# Patient Record
Sex: Female | Born: 1985 | Race: Black or African American | Hispanic: No | Marital: Married | Smoking: Never smoker
Health system: Southern US, Community
[De-identification: ages and names within clinical notes are randomized; demographics above are authoritative.]

## PROBLEM LIST (undated history)

## (undated) DIAGNOSIS — E0500 Thyrotoxicosis with diffuse goiter without thyrotoxic crisis or storm: Secondary | ICD-10-CM

## (undated) DIAGNOSIS — J45909 Unspecified asthma, uncomplicated: Secondary | ICD-10-CM

## (undated) DIAGNOSIS — R569 Unspecified convulsions: Secondary | ICD-10-CM

## (undated) HISTORY — PX: THYROIDECTOMY: SHX17

## (undated) HISTORY — PX: WISDOM TOOTH EXTRACTION: SHX21

---

## 2006-02-27 ENCOUNTER — Other Ambulatory Visit: Admission: RE | Admit: 2006-02-27 | Discharge: 2006-02-27 | Payer: Self-pay | Admitting: Obstetrics and Gynecology

## 2007-01-17 ENCOUNTER — Encounter: Admission: RE | Admit: 2007-01-17 | Discharge: 2007-01-17 | Payer: Self-pay | Admitting: Endocrinology

## 2007-02-07 ENCOUNTER — Encounter: Admission: RE | Admit: 2007-02-07 | Discharge: 2007-02-07 | Payer: Self-pay | Admitting: Endocrinology

## 2007-03-14 ENCOUNTER — Other Ambulatory Visit: Admission: RE | Admit: 2007-03-14 | Discharge: 2007-03-14 | Payer: Self-pay | Admitting: Family Medicine

## 2008-10-16 ENCOUNTER — Other Ambulatory Visit: Admission: RE | Admit: 2008-10-16 | Discharge: 2008-10-16 | Payer: Self-pay | Admitting: Family Medicine

## 2009-10-29 ENCOUNTER — Other Ambulatory Visit: Admission: RE | Admit: 2009-10-29 | Discharge: 2009-10-29 | Payer: Self-pay | Admitting: Family Medicine

## 2011-02-20 ENCOUNTER — Other Ambulatory Visit (HOSPITAL_COMMUNITY)
Admission: RE | Admit: 2011-02-20 | Discharge: 2011-02-20 | Disposition: A | Discharge status: Home / Self Care | Payer: 59 | Source: Ambulatory Visit | Attending: Family Medicine | Admitting: Family Medicine

## 2011-02-20 ENCOUNTER — Other Ambulatory Visit: Payer: Self-pay | Admitting: Family Medicine

## 2011-02-20 DIAGNOSIS — Z01419 Encounter for gynecological examination (general) (routine) without abnormal findings: Secondary | ICD-10-CM | POA: Insufficient documentation

## 2011-02-27 ENCOUNTER — Other Ambulatory Visit: Payer: Self-pay | Admitting: Family Medicine

## 2011-02-27 DIAGNOSIS — IMO0002 Reserved for concepts with insufficient information to code with codable children: Secondary | ICD-10-CM

## 2011-02-28 ENCOUNTER — Ambulatory Visit
Admission: RE | Admit: 2011-02-28 | Discharge: 2011-02-28 | Disposition: A | Discharge status: Home / Self Care | Payer: 59 | Source: Ambulatory Visit | Attending: Family Medicine | Admitting: Family Medicine

## 2011-02-28 DIAGNOSIS — IMO0002 Reserved for concepts with insufficient information to code with codable children: Secondary | ICD-10-CM

## 2012-02-27 ENCOUNTER — Other Ambulatory Visit: Payer: Self-pay | Admitting: Family Medicine

## 2012-02-27 ENCOUNTER — Other Ambulatory Visit (HOSPITAL_COMMUNITY)
Admission: RE | Admit: 2012-02-27 | Discharge: 2012-02-27 | Disposition: A | Discharge status: Home / Self Care | Payer: 59 | Source: Ambulatory Visit | Attending: Family Medicine | Admitting: Family Medicine

## 2012-02-27 DIAGNOSIS — Z01419 Encounter for gynecological examination (general) (routine) without abnormal findings: Secondary | ICD-10-CM | POA: Insufficient documentation

## 2013-10-08 ENCOUNTER — Other Ambulatory Visit (HOSPITAL_COMMUNITY)
Admission: RE | Admit: 2013-10-08 | Discharge: 2013-10-08 | Disposition: A | Discharge status: Home / Self Care | Payer: 59 | Source: Ambulatory Visit | Attending: Family Medicine | Admitting: Family Medicine

## 2013-10-08 ENCOUNTER — Other Ambulatory Visit: Payer: Self-pay | Admitting: Physician Assistant

## 2013-10-08 DIAGNOSIS — Z01419 Encounter for gynecological examination (general) (routine) without abnormal findings: Secondary | ICD-10-CM | POA: Insufficient documentation

## 2014-10-16 ENCOUNTER — Other Ambulatory Visit (HOSPITAL_COMMUNITY)
Admission: RE | Admit: 2014-10-16 | Discharge: 2014-10-16 | Disposition: A | Discharge status: Home / Self Care | Payer: BLUE CROSS/BLUE SHIELD | Source: Ambulatory Visit | Attending: Family Medicine | Admitting: Family Medicine

## 2014-10-16 ENCOUNTER — Other Ambulatory Visit: Payer: Self-pay | Admitting: Family Medicine

## 2014-10-16 DIAGNOSIS — Z01419 Encounter for gynecological examination (general) (routine) without abnormal findings: Secondary | ICD-10-CM | POA: Insufficient documentation

## 2014-10-20 LAB — CYTOLOGY - PAP

## 2017-04-30 ENCOUNTER — Other Ambulatory Visit (HOSPITAL_COMMUNITY)
Admission: RE | Admit: 2017-04-30 | Discharge: 2017-04-30 | Disposition: A | Discharge status: Home / Self Care | Payer: BLUE CROSS/BLUE SHIELD | Source: Ambulatory Visit | Attending: Family Medicine | Admitting: Family Medicine

## 2017-04-30 ENCOUNTER — Other Ambulatory Visit: Payer: Self-pay | Admitting: Family Medicine

## 2017-04-30 DIAGNOSIS — Z0000 Encounter for general adult medical examination without abnormal findings: Secondary | ICD-10-CM | POA: Insufficient documentation

## 2017-05-02 LAB — CYTOLOGY - PAP
DIAGNOSIS: NEGATIVE
HPV (WINDOPATH): NOT DETECTED

## 2017-05-29 NOTE — L&D Delivery Note (Signed)
Operative Delivery Note At 2:48 PM a viable female was delivered via Vaginal, Vacuum Investment banker, operational(Extractor).  Presentation: vertex; Position: Occiput,, Anterior; Station: +4.  Verbal consent: obtained from patient.  Risks and benefits discussed in detail.  Risks include, but are not limited to the risks of anesthesia, bleeding, infection, damage to maternal tissues, fetal cephalhematoma.  There is also the risk of inability to effect vaginal delivery of the head, or shoulder dystocia that cannot be resolved by established maneuvers, leading to the need for emergency cesarean section.  APGAR: 8, 9; weight  pending.   Placenta status: to path Cord:  with the following complications: .  Cord pH: n/a  Anesthesia:  Epidural, local Instruments: Mushroom cup Episiotomy: Right Mediolateral Lacerations: None Suture Repair: 2.0 3.0 vicryl Est. Blood Loss (mL): 158  Mom to postpartum.  Baby to Couplet care / Skin to Skin.  Candace SellerJennifer M Raylie Mcintosh 05/23/2018, 3:38 PM

## 2017-09-24 LAB — OB RESULTS CONSOLE ANTIBODY SCREEN: Antibody Screen: NEGATIVE

## 2017-09-24 LAB — OB RESULTS CONSOLE RUBELLA ANTIBODY, IGM: Rubella: IMMUNE

## 2017-09-24 LAB — OB RESULTS CONSOLE HIV ANTIBODY (ROUTINE TESTING): HIV: NONREACTIVE

## 2017-09-24 LAB — OB RESULTS CONSOLE ABO/RH: RH Type: NEGATIVE

## 2017-09-24 LAB — OB RESULTS CONSOLE HEPATITIS B SURFACE ANTIGEN: Hepatitis B Surface Ag: NEGATIVE

## 2017-09-24 LAB — OB RESULTS CONSOLE RPR: RPR: NONREACTIVE

## 2017-09-24 LAB — OB RESULTS CONSOLE GC/CHLAMYDIA
Chlamydia: NEGATIVE
GC PROBE AMP, GENITAL: NEGATIVE

## 2017-09-24 LAB — OB RESULTS CONSOLE GBS: STREP GROUP B AG: POSITIVE

## 2018-04-09 ENCOUNTER — Encounter (HOSPITAL_COMMUNITY): Payer: Self-pay | Admitting: *Deleted

## 2018-04-09 ENCOUNTER — Inpatient Hospital Stay (HOSPITAL_BASED_OUTPATIENT_CLINIC_OR_DEPARTMENT_OTHER): Payer: BLUE CROSS/BLUE SHIELD

## 2018-04-09 ENCOUNTER — Inpatient Hospital Stay (HOSPITAL_COMMUNITY)
Admission: AD | Admit: 2018-04-09 | Discharge: 2018-04-09 | Disposition: A | Discharge status: Home / Self Care | Payer: BLUE CROSS/BLUE SHIELD | Source: Ambulatory Visit | Attending: Obstetrics & Gynecology | Admitting: Obstetrics & Gynecology

## 2018-04-09 DIAGNOSIS — O26893 Other specified pregnancy related conditions, third trimester: Secondary | ICD-10-CM | POA: Diagnosis not present

## 2018-04-09 DIAGNOSIS — B379 Candidiasis, unspecified: Secondary | ICD-10-CM

## 2018-04-09 DIAGNOSIS — N888 Other specified noninflammatory disorders of cervix uteri: Secondary | ICD-10-CM | POA: Diagnosis not present

## 2018-04-09 DIAGNOSIS — O4693 Antepartum hemorrhage, unspecified, third trimester: Secondary | ICD-10-CM | POA: Diagnosis not present

## 2018-04-09 DIAGNOSIS — Z3A34 34 weeks gestation of pregnancy: Secondary | ICD-10-CM

## 2018-04-09 DIAGNOSIS — O4290 Premature rupture of membranes, unspecified as to length of time between rupture and onset of labor, unspecified weeks of gestation: Secondary | ICD-10-CM | POA: Diagnosis not present

## 2018-04-09 DIAGNOSIS — Z0371 Encounter for suspected problem with amniotic cavity and membrane ruled out: Secondary | ICD-10-CM

## 2018-04-09 HISTORY — DX: Unspecified asthma, uncomplicated: J45.909

## 2018-04-09 HISTORY — DX: Unspecified convulsions: R56.9

## 2018-04-09 HISTORY — DX: Thyrotoxicosis with diffuse goiter without thyrotoxic crisis or storm: E05.00

## 2018-04-09 LAB — URINALYSIS, ROUTINE W REFLEX MICROSCOPIC
Bacteria, UA: NONE SEEN
Bilirubin Urine: NEGATIVE
GLUCOSE, UA: NEGATIVE mg/dL
Hgb urine dipstick: NEGATIVE
KETONES UR: NEGATIVE mg/dL
Nitrite: NEGATIVE
PROTEIN: NEGATIVE mg/dL
Specific Gravity, Urine: 1.005 (ref 1.005–1.030)
pH: 6 (ref 5.0–8.0)

## 2018-04-09 MED ORDER — TERCONAZOLE 0.8 % VA CREA: 1.0000 | TOPICAL_CREAM | Freq: Every day | VAGINAL | 0 refills | Status: DC

## 2018-04-09 NOTE — MAU Note (Signed)
Noted some wetness yesterday and again last night.  No big gush. Just feels wet.  Having some cramping. No bleeding.

## 2018-04-09 NOTE — MAU Provider Note (Signed)
S: Ms. Lorenda PeckCambrelle R Ethelene Brownsnthony is a 32 y.o. G1P0 at 851w4d  who presents to MAU today complaining of leaking of fluid since last night, never had a gush of fluid, however has been leaking fluid. She denies vaginal bleeding. She denies contractions; + cramping.  She reports normal fetal movement.    O: BP 123/74 (BP Location: Left Arm)   Pulse 87   Temp 98.4 F (36.9 C) (Oral)   Resp 18   Wt 106 kg  GENERAL: Well-developed, well-nourished female in no acute distress.  HEAD: Normocephalic, atraumatic.  CHEST: Normal effort of breathing, regular heart rate ABDOMEN: Soft, nontender, gravid PELVIC: Normal external female genitalia. Vagina is pink and rugated. Cervix with normal contour, no lesions. Normal discharge.  Negative pooling.  No bleeding noted in the vault; When cervix was touched with swab bleeding noted. Cervix appears friable.   Cervical exam:  Dilation: Fingertip Exam by:: Shela CommonsJ. Duel Conrad NP   Fetal Monitoring: Baseline: 130 bpm Variability: moderate  Accelerations: 15x15 Decelerations: None Contractions: occasional UI   Results for orders placed or performed during the hospital encounter of 04/09/18 (from the past 24 hour(s))  Urinalysis, Routine w reflex microscopic     Status: Abnormal   Collection Time: 04/09/18 10:32 AM  Result Value Ref Range   Color, Urine STRAW (A) YELLOW   APPearance CLEAR CLEAR   Specific Gravity, Urine 1.005 1.005 - 1.030   pH 6.0 5.0 - 8.0   Glucose, UA NEGATIVE NEGATIVE mg/dL   Hgb urine dipstick NEGATIVE NEGATIVE   Bilirubin Urine NEGATIVE NEGATIVE   Ketones, ur NEGATIVE NEGATIVE mg/dL   Protein, ur NEGATIVE NEGATIVE mg/dL   Nitrite NEGATIVE NEGATIVE   Leukocytes, UA TRACE (A) NEGATIVE   RBC / HPF 0-5 0 - 5 RBC/hpf   WBC, UA 0-5 0 - 5 WBC/hpf   Bacteria, UA NONE SEEN NONE SEEN   Mucus PRESENT      A: SIUP at 151w4d Friable cervix  Unable to collect amnisure due to bleeding, fern slide difficult to read due to red cells, Limited US shows  AFI 20 Membranes intact  P:  Discharge home in stable condition F/U with MD as scheduled  Bleeding precautions Strict return precautions.  Pelvic rest   Mayur Duman, Harolyn RutherfordJennifer I, NP 04/09/2018 8:15 PM

## 2018-04-09 NOTE — Discharge Instructions (Signed)
Braxton Hicks Contractions °Contractions of the uterus can occur throughout pregnancy, but they are not always a sign that you are in labor. You may have practice contractions called Braxton Hicks contractions. These false labor contractions are sometimes confused with true labor. °What are Braxton Hicks contractions? °Braxton Hicks contractions are tightening movements that occur in the muscles of the uterus before labor. Unlike true labor contractions, these contractions do not result in opening (dilation) and thinning of the cervix. Toward the end of pregnancy (32-34 weeks), Braxton Hicks contractions can happen more often and may become stronger. These contractions are sometimes difficult to tell apart from true labor because they can be very uncomfortable. You should not feel embarrassed if you go to the hospital with false labor. °Sometimes, the only way to tell if you are in true labor is for your health care provider to look for changes in the cervix. The health care provider will do a physical exam and may monitor your contractions. If you are not in true labor, the exam should show that your cervix is not dilating and your water has not broken. °If there are other health problems associated with your pregnancy, it is completely safe for you to be sent home with false labor. You may continue to have Braxton Hicks contractions until you go into true labor. °How to tell the difference between true labor and false labor °True labor °· Contractions last 30-70 seconds. °· Contractions become very regular. °· Discomfort is usually felt in the top of the uterus, and it spreads to the lower abdomen and low back. °· Contractions do not go away with walking. °· Contractions usually become more intense and increase in frequency. °· The cervix dilates and gets thinner. °False labor °· Contractions are usually shorter and not as strong as true labor contractions. °· Contractions are usually irregular. °· Contractions  are often felt in the front of the lower abdomen and in the groin. °· Contractions may go away when you walk around or change positions while lying down. °· Contractions get weaker and are shorter-lasting as time goes on. °· The cervix usually does not dilate or become thin. °Follow these instructions at home: °· Take over-the-counter and prescription medicines only as told by your health care provider. °· Keep up with your usual exercises and follow other instructions from your health care provider. °· Eat and drink lightly if you think you are going into labor. °· If Braxton Hicks contractions are making you uncomfortable: °? Change your position from lying down or resting to walking, or change from walking to resting. °? Sit and rest in a tub of warm water. °? Drink enough fluid to keep your urine pale yellow. Dehydration may cause these contractions. °? Do slow and deep breathing several times an hour. °· Keep all follow-up prenatal visits as told by your health care provider. This is important. °Contact a health care provider if: °· You have a fever. °· You have continuous pain in your abdomen. °Get help right away if: °· Your contractions become stronger, more regular, and closer together. °· You have fluid leaking or gushing from your vagina. °· You pass blood-tinged mucus (bloody show). °· You have bleeding from your vagina. °· You have low back pain that you never had before. °· You feel your baby’s head pushing down and causing pelvic pressure. °· Your baby is not moving inside you as much as it used to. °Summary °· Contractions that occur before labor are called Braxton   Hicks contractions, false labor, or practice contractions. °· Braxton Hicks contractions are usually shorter, weaker, farther apart, and less regular than true labor contractions. True labor contractions usually become progressively stronger and regular and they become more frequent. °· Manage discomfort from Braxton Hicks contractions by  changing position, resting in a warm bath, drinking plenty of water, or practicing deep breathing. °This information is not intended to replace advice given to you by your health care provider. Make sure you discuss any questions you have with your health care provider. °Document Released: 09/28/2016 Document Revised: 09/28/2016 Document Reviewed: 09/28/2016 °Elsevier Interactive Patient Education © 2018 [employer] ° °Safe Medications in Pregnancy  ° °Acne: °Benzoyl Peroxide °Salicylic Acid ° °Backache/Headache: °Tylenol: 2 regular strength every 4 hours OR °             2 Extra strength every 6 hours ° °Colds/Coughs/Allergies: °Benadryl (alcohol free) 25 mg every 6 hours as needed °Breath right strips °Claritin °Cepacol throat lozenges °Chloraseptic throat spray °Cold-Eeze- up to three times per day °Cough drops, alcohol free °Flonase (by prescription only) °Guaifenesin °Mucinex °Robitussin DM (plain only, alcohol free) °Saline nasal spray/drops °Sudafed (pseudoephedrine) & Actifed ** use only after [redacted] weeks gestation and if you do not have high blood pressure °Tylenol °Vicks Vaporub °Zinc lozenges °Zyrtec  ° °Constipation: °Colace °Ducolax suppositories °Fleet enema °Glycerin suppositories °Metamucil °Milk of magnesia °Miralax °Senokot °Smooth move tea ° °Diarrhea: °Kaopectate °Imodium A-D ° °*NO pepto Bismol ° °Hemorrhoids: °Anusol °Anusol HC °Preparation H °Tucks ° °Indigestion: °Tums °Maalox °Mylanta °Zantac  °Pepcid ° °Insomnia: °Benadryl (alcohol free) 25mg every 6 hours as needed °Tylenol PM °Unisom, no Gelcaps ° °Leg Cramps: °Tums °MagGel ° °Nausea/Vomiting:  °Bonine °Dramamine °Emetrol °Ginger extract °Sea bands °Meclizine  °Nausea medication to take during pregnancy:  °Unisom (doxylamine succinate 25 mg tablets) Take one tablet daily at bedtime. If symptoms are not adequately controlled, the dose can be increased to a maximum recommended dose of two tablets daily (1/2 tablet in the morning, 1/2 tablet  mid-afternoon and one at bedtime). °Vitamin B6 100mg tablets. Take one tablet twice a day (up to 200 mg per day). ° °Skin Rashes: °Aveeno products °Benadryl cream or 25mg every 6 hours as needed °Calamine Lotion °1% cortisone cream ° °Yeast infection: °Gyne-lotrimin 7 °Monistat 7 ° ° °**If taking multiple medications, please check labels to avoid duplicating the same active ingredients °**take medication as directed on the label °** Do not exceed 4000 mg of tylenol in 24 hours °**Do not take medications that contain aspirin or ibuprofen ° ° ° ° °

## 2018-05-14 ENCOUNTER — Encounter (HOSPITAL_COMMUNITY): Payer: Self-pay | Admitting: *Deleted

## 2018-05-14 ENCOUNTER — Telehealth (HOSPITAL_COMMUNITY): Payer: Self-pay | Admitting: *Deleted

## 2018-05-17 ENCOUNTER — Inpatient Hospital Stay (HOSPITAL_COMMUNITY): Admission: AD | Admit: 2018-05-17 | Payer: BLUE CROSS/BLUE SHIELD | Source: Home / Self Care

## 2018-05-22 NOTE — H&P (Signed)
HPI: 32 y/o G1P0 @ 9162w6d estimated gestational age (as dated by LMP c/w 20 week ultrasound) presents for IOL.   no Leaking of Fluid,   no Vaginal Bleeding,   irregular Uterine Contractions,  + Fetal Movement.  Prenatal care has been provided by Dr. Charlotta Newtonzan  ROS: no HA, no epigastric pain, no visual changes.    Pregnancy complicated by: 1) O neg, s/p RhoGAM 2) Hypothyroidism- Levothyroxine increased to 175mcg -followed q trimester 3) Asthma- no meds, asymptomatic during pregnancy 4) GBS positive, plan for PCN in labor   Prenatal Transfer Tool  Maternal Diabetes: No Genetic Screening: Normal Maternal Ultrasounds/Referrals: Normal Fetal Ultrasounds or other Referrals:  None Maternal Substance Abuse:  No Significant Maternal Medications:  Meds include: Syntroid Significant Maternal Lab Results: Lab values include: Group B Strep positive, Rh negative   PNL:  GBS positive, Rub Immune, Hep B neg, RPR NR, HIV neg, GC/C neg, glucola:87 Hgb: 12 Blood type: O neg, antibody neg  Immunizations: Tdap: 10/10 Flu: 8/29  OBHx: primip PMHx:  Hypothyroidism, asthma Meds:  PNV, synthroid Allergy:  No Known Allergies SurgHx: none SocHx:   no Tobacco, no  EtOH, no Illicit Drugs  O:  Gen. AAOx3, NAD CV.  RRR  No murmur.  Resp. CTAB, no wheeze or crackles. Abd. Gravid,  no tenderness,  no rigidity,  no guarding Extr.  no edema B/L , no calf tenderness, neg Homan's B/L FHT: 125bpm SVE: 3-4/50/-2, vertex    Labs: see orders  A/P:  32 y.o. G1P0 @ 9762w6d EGA who presents for IOL -FWB:  Reassuring by doppler -Labor: plan for induction with pitocin as pt has a favorable cervix -GBS: positive, PCN per protocol -Pain management: IV or epidural upon request  Myna HidalgoJennifer Shikita Vaillancourt, DO [phone] (cell) [phone] (office)

## 2018-05-23 ENCOUNTER — Other Ambulatory Visit: Payer: Self-pay

## 2018-05-23 ENCOUNTER — Inpatient Hospital Stay (HOSPITAL_COMMUNITY): Payer: BLUE CROSS/BLUE SHIELD | Admitting: Anesthesiology

## 2018-05-23 ENCOUNTER — Inpatient Hospital Stay (HOSPITAL_COMMUNITY)
Admission: RE | Admit: 2018-05-23 | Discharge: 2018-05-25 | DRG: 807 | Disposition: A | Discharge status: Home / Self Care | Payer: BLUE CROSS/BLUE SHIELD | Attending: Obstetrics & Gynecology | Admitting: Obstetrics & Gynecology

## 2018-05-23 ENCOUNTER — Inpatient Hospital Stay (HOSPITAL_COMMUNITY): Admission: RE | Admit: 2018-05-23 | Payer: BLUE CROSS/BLUE SHIELD | Source: Ambulatory Visit

## 2018-05-23 ENCOUNTER — Encounter (HOSPITAL_COMMUNITY): Payer: Self-pay

## 2018-05-23 DIAGNOSIS — D649 Anemia, unspecified: Secondary | ICD-10-CM | POA: Diagnosis present

## 2018-05-23 DIAGNOSIS — O99280 Endocrine, nutritional and metabolic diseases complicating pregnancy, unspecified trimester: Secondary | ICD-10-CM | POA: Diagnosis present

## 2018-05-23 DIAGNOSIS — E039 Hypothyroidism, unspecified: Secondary | ICD-10-CM | POA: Diagnosis present

## 2018-05-23 DIAGNOSIS — J45909 Unspecified asthma, uncomplicated: Secondary | ICD-10-CM | POA: Diagnosis present

## 2018-05-23 DIAGNOSIS — Z6791 Unspecified blood type, Rh negative: Secondary | ICD-10-CM

## 2018-05-23 DIAGNOSIS — O26893 Other specified pregnancy related conditions, third trimester: Secondary | ICD-10-CM | POA: Diagnosis present

## 2018-05-23 DIAGNOSIS — O9902 Anemia complicating childbirth: Secondary | ICD-10-CM | POA: Diagnosis present

## 2018-05-23 DIAGNOSIS — O99284 Endocrine, nutritional and metabolic diseases complicating childbirth: Secondary | ICD-10-CM | POA: Diagnosis present

## 2018-05-23 DIAGNOSIS — O99824 Streptococcus B carrier state complicating childbirth: Secondary | ICD-10-CM | POA: Diagnosis present

## 2018-05-23 DIAGNOSIS — O480 Post-term pregnancy: Secondary | ICD-10-CM | POA: Diagnosis present

## 2018-05-23 DIAGNOSIS — O9952 Diseases of the respiratory system complicating childbirth: Secondary | ICD-10-CM | POA: Diagnosis present

## 2018-05-23 DIAGNOSIS — Z3A40 40 weeks gestation of pregnancy: Secondary | ICD-10-CM

## 2018-05-23 LAB — TYPE AND SCREEN
ABO/RH(D): O NEG
Antibody Screen: NEGATIVE

## 2018-05-23 LAB — RPR: RPR Ser Ql: NONREACTIVE

## 2018-05-23 LAB — CBC
HCT: 35.2 % — ABNORMAL LOW (ref 36.0–46.0)
Hemoglobin: 11.7 g/dL — ABNORMAL LOW (ref 12.0–15.0)
MCH: 32 pg (ref 26.0–34.0)
MCHC: 33.2 g/dL (ref 30.0–36.0)
MCV: 96.2 fL (ref 80.0–100.0)
Platelets: 176 10*3/uL (ref 150–400)
RBC: 3.66 MIL/uL — ABNORMAL LOW (ref 3.87–5.11)
RDW: 13.8 % (ref 11.5–15.5)
WBC: 9.7 10*3/uL (ref 4.0–10.5)
nRBC: 0 % (ref 0.0–0.2)

## 2018-05-23 LAB — ABO/RH: ABO/RH(D): O NEG

## 2018-05-23 MED ORDER — OXYCODONE-ACETAMINOPHEN 5-325 MG PO TABS: 1.0000 | ORAL_TABLET | ORAL | Status: DC | PRN

## 2018-05-23 MED ORDER — ONDANSETRON HCL 4 MG/2ML IJ SOLN: 4.0000 mg | INTRAMUSCULAR | Status: DC | PRN

## 2018-05-23 MED ORDER — LACTATED RINGERS IV SOLN: 500.0000 mL | Freq: Once | INTRAVENOUS | Status: DC

## 2018-05-23 MED ORDER — LACTATED RINGERS IV SOLN
INTRAVENOUS | Status: DC
  Administered 2018-05-23 (×2): via INTRAVENOUS

## 2018-05-23 MED ORDER — WITCH HAZEL-GLYCERIN EX PADS: 1.0000 "application " | MEDICATED_PAD | CUTANEOUS | Status: DC | PRN

## 2018-05-23 MED ORDER — ONDANSETRON HCL 4 MG PO TABS: 4.0000 mg | ORAL_TABLET | ORAL | Status: DC | PRN

## 2018-05-23 MED ORDER — PRENATAL MULTIVITAMIN CH
1.0000 | ORAL_TABLET | Freq: Every day | ORAL | Status: DC
  Administered 2018-05-24 – 2018-05-25 (×2): 1 via ORAL
  Filled 2018-05-23 (×2): qty 1

## 2018-05-23 MED ORDER — TERBUTALINE SULFATE 1 MG/ML IJ SOLN
0.2500 mg | Freq: Once | INTRAMUSCULAR | Status: DC | PRN
  Filled 2018-05-23: qty 1

## 2018-05-23 MED ORDER — OXYTOCIN 40 UNITS IN LACTATED RINGERS INFUSION - SIMPLE MED: 2.5000 [IU]/h | INTRAVENOUS | Status: DC

## 2018-05-23 MED ORDER — SODIUM CHLORIDE 0.9 % IV SOLN
5.0000 10*6.[IU] | Freq: Once | INTRAVENOUS | Status: AC
  Administered 2018-05-23: 5 10*6.[IU] via INTRAVENOUS
  Filled 2018-05-23: qty 5

## 2018-05-23 MED ORDER — OXYTOCIN 40 UNITS IN LACTATED RINGERS INFUSION - SIMPLE MED
1.0000 m[IU]/min | INTRAVENOUS | Status: DC
  Administered 2018-05-23: 2 m[IU]/min via INTRAVENOUS
  Filled 2018-05-23: qty 1000

## 2018-05-23 MED ORDER — SENNOSIDES-DOCUSATE SODIUM 8.6-50 MG PO TABS
2.0000 | ORAL_TABLET | ORAL | Status: DC
  Administered 2018-05-23 – 2018-05-24 (×2): 2 via ORAL
  Filled 2018-05-23 (×2): qty 2

## 2018-05-23 MED ORDER — OXYCODONE-ACETAMINOPHEN 5-325 MG PO TABS: 2.0000 | ORAL_TABLET | ORAL | Status: DC | PRN

## 2018-05-23 MED ORDER — ACETAMINOPHEN 325 MG PO TABS: 650.0000 mg | ORAL_TABLET | ORAL | Status: DC | PRN

## 2018-05-23 MED ORDER — PHENYLEPHRINE 40 MCG/ML (10ML) SYRINGE FOR IV PUSH (FOR BLOOD PRESSURE SUPPORT)
80.0000 ug | PREFILLED_SYRINGE | INTRAVENOUS | Status: DC | PRN
  Filled 2018-05-23 (×2): qty 10

## 2018-05-23 MED ORDER — LIDOCAINE HCL (PF) 1 % IJ SOLN
INTRAMUSCULAR | Status: DC | PRN
  Administered 2018-05-23 (×2): 5 mL via EPIDURAL

## 2018-05-23 MED ORDER — EPHEDRINE 5 MG/ML INJ
10.0000 mg | INTRAVENOUS | Status: DC | PRN
  Filled 2018-05-23: qty 2

## 2018-05-23 MED ORDER — ZOLPIDEM TARTRATE 5 MG PO TABS: 5.0000 mg | ORAL_TABLET | Freq: Every evening | ORAL | Status: DC | PRN

## 2018-05-23 MED ORDER — DIPHENHYDRAMINE HCL 25 MG PO CAPS: 25.0000 mg | ORAL_CAPSULE | Freq: Four times a day (QID) | ORAL | Status: DC | PRN

## 2018-05-23 MED ORDER — IBUPROFEN 600 MG PO TABS
600.0000 mg | ORAL_TABLET | Freq: Four times a day (QID) | ORAL | Status: DC
  Administered 2018-05-23 – 2018-05-25 (×8): 600 mg via ORAL
  Filled 2018-05-23 (×8): qty 1

## 2018-05-23 MED ORDER — DIPHENHYDRAMINE HCL 50 MG/ML IJ SOLN: 12.5000 mg | INTRAMUSCULAR | Status: DC | PRN

## 2018-05-23 MED ORDER — OXYTOCIN BOLUS FROM INFUSION
500.0000 mL | Freq: Once | INTRAVENOUS | Status: AC
  Administered 2018-05-23: 500 mL via INTRAVENOUS

## 2018-05-23 MED ORDER — FENTANYL 2.5 MCG/ML BUPIVACAINE 1/10 % EPIDURAL INFUSION (WH - ANES)
14.0000 mL/h | INTRAMUSCULAR | Status: DC | PRN
  Administered 2018-05-23: 14 mL/h via EPIDURAL
  Filled 2018-05-23: qty 100

## 2018-05-23 MED ORDER — BUTORPHANOL TARTRATE 1 MG/ML IJ SOLN: 1.0000 mg | INTRAMUSCULAR | Status: DC | PRN

## 2018-05-23 MED ORDER — COCONUT OIL OIL
1.0000 "application " | TOPICAL_OIL | Status: DC | PRN
  Administered 2018-05-24: 1 via TOPICAL
  Filled 2018-05-23: qty 120

## 2018-05-23 MED ORDER — SOD CITRATE-CITRIC ACID 500-334 MG/5ML PO SOLN: 30.0000 mL | ORAL | Status: DC | PRN

## 2018-05-23 MED ORDER — LIDOCAINE HCL (PF) 1 % IJ SOLN
30.0000 mL | INTRAMUSCULAR | Status: AC | PRN
  Administered 2018-05-23: 30 mL via SUBCUTANEOUS
  Filled 2018-05-23: qty 30

## 2018-05-23 MED ORDER — PENICILLIN G 3 MILLION UNITS IVPB - SIMPLE MED
3.0000 10*6.[IU] | INTRAVENOUS | Status: DC
  Administered 2018-05-23: 3 10*6.[IU] via INTRAVENOUS
  Filled 2018-05-23 (×3): qty 100

## 2018-05-23 MED ORDER — BENZOCAINE-MENTHOL 20-0.5 % EX AERO
1.0000 "application " | INHALATION_SPRAY | CUTANEOUS | Status: DC | PRN
  Administered 2018-05-23: 1 via TOPICAL
  Filled 2018-05-23: qty 56

## 2018-05-23 MED ORDER — LACTATED RINGERS IV SOLN: 500.0000 mL | INTRAVENOUS | Status: DC | PRN

## 2018-05-23 MED ORDER — ONDANSETRON HCL 4 MG/2ML IJ SOLN
4.0000 mg | Freq: Four times a day (QID) | INTRAMUSCULAR | Status: DC | PRN
  Administered 2018-05-23: 4 mg via INTRAVENOUS
  Filled 2018-05-23: qty 2

## 2018-05-23 MED ORDER — SIMETHICONE 80 MG PO CHEW: 80.0000 mg | CHEWABLE_TABLET | ORAL | Status: DC | PRN

## 2018-05-23 MED ORDER — DIBUCAINE 1 % RE OINT: 1.0000 "application " | TOPICAL_OINTMENT | RECTAL | Status: DC | PRN

## 2018-05-23 NOTE — Progress Notes (Signed)
OB PN:  S: Pt resting comfortably- rates contractions 4/10, no acute complaints  O: BP 129/77   Pulse 68   Temp 97.9 F (36.6 C) (Oral)   Resp 18   Ht 5\' 7"  (1.702 m)   Wt 113.3 kg   BMI 39.12 kg/m   FHT: 140bpm, moderate variablity, + accels, no decels Toco: q492min SVE: 5/80/-2, AROM thick meconium  A/P: [age] y.o. G1P0 @ 6942w6d for IOL 1. FWB: Cat. I 2. Labor: continue Pit per protocol Pain: IV, nitrous or epidural upon request GBS: positive, continue with PCN  Myna HidalgoJennifer Elier Zellars, DO [phone] (cell) [phone] (office)

## 2018-05-23 NOTE — Anesthesia Pain Management Evaluation Note (Signed)
  CRNA Pain Management Visit Note  Patient: Candace Mcintosh, 32 y.o., female  "Hello I am a [facility] of the [facility]. We have an anesthesia team available at all times to provide care throughout the hospital, including epidural management and anesthesia for C-section. I don't know your plan for the delivery whether it a natural birth, water birth, IV sedation, nitrous supplementation, doula or epidural, but we want to meet your pain goals."   1.Was your pain managed to your expectations on prior hospitalizations?   No prior hospitalizations  2.What is your expectation for pain management during this hospitalization?     Doula, natural with possible epidural   3.How can we help you reach that goal? Epidural if needed  Record the patient's initial score and the patient's pain goal.   Pain: 0  Pain Goal: 5 [facility] wants you to be able to say your pain was always managed very well.  [facility] HospitalMARSHALL,Libni Fusaro 05/23/2018

## 2018-05-23 NOTE — Anesthesia Procedure Notes (Signed)
Epidural Patient location during procedure: OB Start time: 05/23/2018 1:23 PM End time: 05/23/2018 1:37 PM  Staffing Anesthesiologist: Lucretia KernWitman, Dontravious Camille E, MD Performed: anesthesiologist   Preanesthetic Checklist Completed: patient identified, pre-op evaluation, timeout performed, IV checked, risks and benefits discussed and monitors and equipment checked  Epidural Patient position: sitting Prep: DuraPrep Patient monitoring: heart rate, continuous pulse ox and blood pressure Approach: midline Location: L3-L4 Injection technique: LOR air  Needle:  Needle type: Tuohy  Needle gauge: 17 G Needle length: 9 cm Needle insertion depth: 7 cm Catheter type: closed end flexible Catheter size: 19 Gauge Catheter at skin depth: 12 cm  Assessment Events: blood not aspirated, injection not painful, no injection resistance, negative IV test and no paresthesia  Additional Notes Reason for block:procedure for pain

## 2018-05-23 NOTE — Anesthesia Preprocedure Evaluation (Signed)
Anesthesia Evaluation  Patient identified by MRN, date of birth, ID band Patient awake    Reviewed: Allergy & Precautions, H&P , NPO status , Patient's Chart, lab work & pertinent test results  History of Anesthesia Complications Negative for: history of anesthetic complications  Airway Mallampati: II  TM Distance: >3 FB Neck ROM: full    Dental no notable dental hx.    Pulmonary asthma (very mild, rare inhaler use) ,    Pulmonary exam normal        Cardiovascular negative cardio ROS Normal cardiovascular exam Rhythm:regular Rate:Normal     Neuro/Psych negative neurological ROS  negative psych ROS   GI/Hepatic negative GI ROS, Neg liver ROS,   Endo/Other  Hypothyroidism (h/o Graves disease s/p thyroidectomy)   Renal/GU negative Renal ROS  negative genitourinary   Musculoskeletal   Abdominal   Peds  Hematology negative hematology ROS (+)   Anesthesia Other Findings   Reproductive/Obstetrics (+) Pregnancy                             Anesthesia Physical Anesthesia Plan  ASA: II  Anesthesia Plan: Epidural   Post-op Pain Management:    Induction:   PONV Risk Score and Plan:   Airway Management Planned:   Additional Equipment:   Intra-op Plan:   Post-operative Plan:   Informed Consent: I have reviewed the patients History and Physical, chart, labs and discussed the procedure including the risks, benefits and alternatives for the proposed anesthesia with the patient or authorized representative who has indicated his/her understanding and acceptance.     Plan Discussed with:   Anesthesia Plan Comments:         Anesthesia Quick Evaluation

## 2018-05-23 NOTE — Anesthesia Postprocedure Evaluation (Signed)
Anesthesia Post Note  Patient: Tobe SosCambrelle R Pettengill  Procedure(s) Performed: AN AD HOC LABOR EPIDURAL     Patient location during evaluation: Mother Baby Anesthesia Type: Epidural Level of consciousness: awake and alert Pain management: pain level controlled Vital Signs Assessment: post-procedure vital signs reviewed and stable Respiratory status: spontaneous breathing Cardiovascular status: blood pressure returned to baseline Postop Assessment: no headache, no backache, epidural receding, no apparent nausea or vomiting, patient able to bend at knees and adequate PO intake Anesthetic complications: no    Last Vitals:  Vitals:   05/23/18 1634 05/23/18 1705  BP: 118/74 104/67  Pulse: 74 67  Resp: 18 18  Temp:  36.7 C  SpO2:      Last Pain:  Vitals:   05/23/18 1705  TempSrc: Oral  PainSc: 0-No pain   Pain Goal:                 Dammon Makarewicz

## 2018-05-24 LAB — CBC
HCT: 29.8 % — ABNORMAL LOW (ref 36.0–46.0)
Hemoglobin: 10 g/dL — ABNORMAL LOW (ref 12.0–15.0)
MCH: 31.7 pg (ref 26.0–34.0)
MCHC: 33.6 g/dL (ref 30.0–36.0)
MCV: 94.6 fL (ref 80.0–100.0)
Platelets: 164 10*3/uL (ref 150–400)
RBC: 3.15 MIL/uL — ABNORMAL LOW (ref 3.87–5.11)
RDW: 13.9 % (ref 11.5–15.5)
WBC: 11.5 10*3/uL — ABNORMAL HIGH (ref 4.0–10.5)
nRBC: 0 % (ref 0.0–0.2)

## 2018-05-24 MED ORDER — RHO D IMMUNE GLOBULIN 1500 UNIT/2ML IJ SOSY
300.0000 ug | PREFILLED_SYRINGE | Freq: Once | INTRAMUSCULAR | Status: AC
  Administered 2018-05-24: 300 ug via INTRAVENOUS
  Filled 2018-05-24: qty 2

## 2018-05-24 MED ORDER — LEVOTHYROXINE SODIUM 175 MCG PO TABS
175.0000 ug | ORAL_TABLET | Freq: Every day | ORAL | Status: DC
  Administered 2018-05-25: 175 ug via ORAL
  Filled 2018-05-24 (×3): qty 1

## 2018-05-24 NOTE — Progress Notes (Addendum)
Postpartum Note Day # 1  S:  Patient resting comfortable in bed.  Pain controlled.  Tolerating general diet. + flatus, no BM.  Lochia moderate.  Ambulating without difficulty.  She denies n/v/f/c, SOB, or CP.  Pt plans on breastfeeding.  O: Temp:  [97.6 F (36.4 C)-98.4 F (36.9 C)] 98 F (36.7 C) (12/27 0607) Pulse Rate:  [64-106] 65 (12/27 0607) Resp:  [18-20] 18 (12/27 0607) BP: (101-134)/(61-95) 106/71 (12/27 0607) SpO2:  [96 %-100 %] 100 % (12/26 1818) Weight:  [113.3 kg] 113.3 kg (12/26 0743) Gen: A&Ox3, NAD CV: RRR, no MRG Resp: CTAB Abdomen: obese, soft, NT, ND Uterus: firm, non-tender, below umbilicus Ext: No edema, no calf tenderness bilaterally  Labs:  Recent Labs    05/23/18 0747 05/24/18 0531  HGB 11.7* 10.0*    A/P: Pt is a [age] y.o. G1P1001 s/p VAVD, PPD#1  - Pain well controlled -GU: UOP is adequate -GI: Tolerating general diet -Activity: encouraged sitting up to chair and ambulation as tolerated -Prophylaxis: early ambulation -Labs: stable as above -Hypothyroidism- Levothyroxine 175mcg daily  Continue with routine postpartum care  Myna HidalgoJennifer Harvey Matlack, DO [phone] (cell) [phone] (office)

## 2018-05-24 NOTE — Lactation Note (Signed)
This note was copied from a baby's chart. Lactation Consultation Note  Patient Name: Candace Mcintosh ZOXWR'UToday's Date: 05/24/2018 Reason for consult: Initial assessment Baby was having some difficulty latching but last 2 feedings went well.  Baby is skin to skin and showing feeding cues.  Assisted with cross cradle hold.  Parents shown how to compress breast tissue for a deeper latch.  Baby latched easily and fed for 10 minutes.  Towards the end of the feeding baby started to slide to end of nipple.  Reminded mom to hold baby close and use good support.  Nipple slightly flattened when baby came off.  Mom will ask RN for coconut oil.  Reviewed basics and answered questions.  Breastfeeding consultation services and support information given and reviewed.    Maternal Data    Feeding Feeding Type: Breast Fed  LATCH Score Latch: Grasps breast easily, tongue down, lips flanged, rhythmical sucking.  Audible Swallowing: A few with stimulation  Type of Nipple: Everted at rest and after stimulation  Comfort (Breast/Nipple): Filling, red/small blisters or bruises, mild/mod discomfort  Hold (Positioning): Assistance needed to correctly position infant at breast and maintain latch.  LATCH Score: 7  Interventions Interventions: Assisted with latch;Breast compression;Skin to skin;Adjust position;Breast massage;Support pillows;Position options  Lactation Tools Discussed/Used Initiated by:: KPotter RN  Date initiated:: 05/24/18   Consult Status Consult Status: Follow-up Date: 05/25/18 Follow-up type: In-patient    Huston FoleyMOULDEN, Damoni Erker S 05/24/2018, 9:48 AM

## 2018-05-25 DIAGNOSIS — O99280 Endocrine, nutritional and metabolic diseases complicating pregnancy, unspecified trimester: Secondary | ICD-10-CM

## 2018-05-25 DIAGNOSIS — E039 Hypothyroidism, unspecified: Secondary | ICD-10-CM | POA: Diagnosis present

## 2018-05-25 LAB — RH IG WORKUP (INCLUDES ABO/RH)
ABO/RH(D): O NEG
Fetal Screen: NEGATIVE
Gestational Age(Wks): 40.6
Unit division: 0

## 2018-05-25 MED ORDER — IBUPROFEN 600 MG PO TABS: 600.0000 mg | ORAL_TABLET | Freq: Four times a day (QID) | ORAL | 0 refills | Status: DC | PRN

## 2018-05-25 NOTE — Lactation Note (Signed)
This note was copied from a baby's chart. Lactation Consultation Note  Patient Name: Candace Mcintosh RUEAV'WToday's Date: 05/25/2018 Reason for consult: Follow-up assessment Baby has been cluster feeding and mom very tired this morning. Baby latching without nipple shield.  Discussed milk coming to volume and the prevention and treatment of engorgement.  Questions answered.  Lactation services and support reviewed and encouraged prn.  Maternal Data    Feeding Feeding Type: Breast Fed  LATCH Score                   Interventions    Lactation Tools Discussed/Used     Consult Status Consult Status: Complete Follow-up type: Call as needed    Huston FoleyMOULDEN, Jameon Deller S 05/25/2018, 9:48 AM

## 2018-05-25 NOTE — Discharge Summary (Signed)
OB Discharge Summary     Patient Name: Candace Mcintosh DOB: 07/02/1985 MRN: [id number]  Date of admission: 05/23/2018 Delivering MD: Myna HidalgoZAN, JENNIFER   Date of discharge: 05/25/2018  Admitting diagnosis: 40wks, induction Intrauterine pregnancy: 3635w6d     Secondary diagnosis:  Active Problems:   Labor and delivery, indication for care   Hypothyroidism affecting pregnancy  Additional problems: None     Discharge diagnosis: Term Pregnancy Delivered and hypothyroidism                                                                                                 Post partum procedures:None  Augmentation: AROM and Pitocin  Complications: None  Hospital course:  Induction of Labor With Vaginal Delivery   32 y.o. yo G1P1001 at 3935w6d was admitted to the hospital 05/23/2018 for induction of labor.  Indication for induction: Postdates.  Patient had an uncomplicated labor course as follows: Membrane Rupture Time/Date: 12:35 PM ,05/23/2018   Intrapartum Procedures: Episiotomy: Right Mediolateral [4]                                         Lacerations:  None [1]  Patient had delivery of a Viable infant.  Information for the patient's newborn:  Koren Boundnthony, Girl Mairany [[id number]][[id number]]  Delivery Method: Vag-Spont   05/23/2018  Details of delivery can be found in separate delivery note.  Patient had a routine postpartum course. Patient is discharged home 05/25/18.  Physical exam  Vitals:   05/24/18 0607 05/24/18 1549 05/24/18 2226 05/25/18 0612  BP: 106/71 103/68 116/70 102/68  Pulse: 65 72 79 73  Resp: 18  18 18   Temp: 98 F (36.7 C) 98 F (36.7 C) 98.2 F (36.8 C) 97.8 F (36.6 C)  TempSrc: Oral Oral Oral Oral  SpO2:  98%    Weight:      Height:       General: alert, cooperative and no distress Lochia: appropriate Uterine Fundus: firm Incision: N/A DVT Evaluation: No evidence of DVT seen on physical exam. Labs: Lab Results  Component Value Date   WBC 11.5 (H)  05/24/2018   HGB 10.0 (L) 05/24/2018   HCT 29.8 (L) 05/24/2018   MCV 94.6 05/24/2018   PLT 164 05/24/2018   No flowsheet data found.  Discharge instruction: per After Visit Summary and "Baby and Me Booklet".  After visit meds:  Allergies as of 05/25/2018   No Known Allergies     Medication List    STOP taking these medications   terconazole 0.8 % vaginal cream Commonly known as:  TERAZOL 3     TAKE these medications   ibuprofen 600 MG tablet Commonly known as:  ADVIL,MOTRIN Take 1 tablet (600 mg total) by mouth every 6 (six) hours as needed.   levothyroxine 175 MCG tablet Commonly known as:  SYNTHROID, LEVOTHROID Take 175 mcg by mouth daily before breakfast.   prenatal multivitamin Tabs tablet Take 1 tablet by mouth daily at 12 noon.  Diet: routine diet  Activity: Advance as tolerated. Pelvic rest for 6 weeks.   Outpatient follow up:6 weeks Follow up Appt:No future appointments. Follow up Visit:No follow-ups on file.  Postpartum contraception: Not Discussed  Newborn Data: Live born female  Birth Weight: 7 lb 6.3 oz (3355 g) APGAR: 8, 9  Newborn Delivery   Birth date/time:  05/23/2018 14:48:00 Delivery type:  Vaginal, Vacuum (Extractor)     Baby Feeding: Breast Disposition:home with mother   05/25/2018 Gerald Leitzara Deryn Massengale, MD

## 2018-06-13 ENCOUNTER — Ambulatory Visit: Payer: Self-pay

## 2018-06-13 NOTE — Lactation Note (Signed)
This note was copied from a baby's chart. 06/13/2018  Name: Candace Mcintosh MRN: [id number] Date of Birth: 05/23/2020 Gestational Age: Gestational Age: [redacted]w[redacted]d Birth Weight: 118.3 oz Weight today:    6 pounds 11.2 ounces (3038 grams) with clean newborn diaper  35 week old infant presents today with mom and maternal aunt for feeding assessment. Infant has had difficulty gaining weight. Mom was previously using a nipple shield due to clamping and pain with feedings and has weaned off this week. Infant was pulling on and off the breast with the NS, mom reports she sustains the latch better without the NS.  Infant noted to be tongue sucking a lot at the office.   Infant awake and alert with moist mucous membranes. Infant is sleepy at the breast. She cries very little per mom.   Infant has lost 106 grams in the last 19 days. Per mom her last weight was 6 pounds 9 ounces on Monday 1/13 at Pediatricians office. This shows that infant has gained 2 ounces in the last 3 days.  She was 6 pounds 6 ounces 1/9 by [facility] Nurse infant is 317 grams below birthweight.   Mom was instructed to feed infant every 2 hours and has been doing so since last Thursday 1/9 per recommendation of [facility] Nurse and agreed upon by the Pediatrician.   Parents are awakening infant to feed every 2 hours and feeds can be for 10-60 minutes. Mom has to stimulate infant a lot with feeding to hear active swallows. Mom is having difficulty pumping as infant is feeding so frequently. Mom is getting 3 ounces the once a day she is getting to pump. Infant is being supplemented with the 3 ounces throughout the day. Mom reports infant would like to eat more most of the time but they stop her at one ounce as that is what they were told to feed the infant.   Mom reports she would prefer not to use formula at this time. Mom is pleased infant is starting to gain weight.   Infant is sleepy at the breast and is easy to stimulate  in the beginning and then becomes more difficult to awaken as the feeding progresses. neither mom nor infant are getting much rest and sleep between feeds.    Infant latched to the right breast in the football hold. She fed intermittently for about 20 minutes needing a lot of stimulation and transferred 22 ml. Nipple flattened post BF. Mom with little pain after initial latch. Infant then removed from breast to reawaken and latched to the left breast in the football hold and fed for about 15 minutes and transferred 18 ml. Mom reports this feeding is longer that usual at home. Showed mom how to flange upper lip and widen gape after latch. Nipples were not as compressed after help with latch.   Reviewed bottle feeding. Mom reports infant feeds fast on the bottle. Infant drools some on the bottle. Enc mom to use the Dr. Theora Gianotti she has at home for now. Reviewed paced bottle feeding.    LC does not recommend that infant continues to be fed every 2 hours unless she is cueing to feed as that plan is difficult for mom and infant to maintain long term. LC feels that infant is sleepy at the breast and lower on energy due to deficiency on caloric intake. Would recommend that infant eat every 3 hours and sooner with feeding cues and then pump and supplement EBM for infant  until she is transferring more consistently. Discussed with mom that 3 hours feeds need to continue until infant transferring more consistently and gaining weight well.   Mom had blood drawn yesterday to check thyroid.   Infant to follow up with Dr. Nash Dimmer on 1/17. [facility] to follow up prn. Infant to follow up with Lactation in 1 week.   Mom has good support at home. Mom reports all questions have been answered. Mom to call with any questions/concerns as needed.     General Information: Mother's reason for visit: Feeding assessment, weight gain issues in the infant Consult: Initial Lactation consultant: Noralee Stain  RN,IBCLC Breastfeeding experience: using a NS initially. Tongue sucking in the infant. Sleepy at the breast. Not gaining well Maternal medical conditions: Thyroid(Graves on Synthroid) Maternal medications: Other, Pre-natal vitamin(Fish Oil, Synthroid)  Breastfeeding History: Frequency of breast feeding: every 2 hours (10-12 x a day) in the last week Duration of feeding: 10-60 minutes  Supplementation: Supplement method: bottle(Kiinde)         Breast milk volume: 1 ounce Breast milk frequency: 3 x a day Total breast milk volume per day: 3 ounces Pump type: Medela pump in style Pump frequency: 1 x a day Pump volume: 3 ounces  Infant Output Assessment: Voids per 24 hours: 6+, 2 voids in the office Urine color: Clear yellow Stools per 24 hours: 3-4, 1 in the office Stool color: Yellow  Breast Assessment: Breast: Soft, Compressible Nipple: Erect Pain level: 1(with feeding, 2-6 with initial latch) Pain interventions: Bra, Coconut oil  Feeding Assessment: Infant oral assessment: Variance Infant oral assessment comment: Infant with thin labial frenulum that inserts at the center of the gum ridge. upper lip with some resistance with flanging, needs flanging on the breast after latch. Infant with a red ring around her mouth after feeding.  Infant with tongue thrusting noted on the breast and on a gloved finger. Infant with strong suckle and good tongue cupping when suckling on gloved finger. mom's right nipple was compressed post feeding. Left nipple slightly compressed post feeding.  Positioning: Football(right breast) Latch: 1 - Repeated attempts needed to sustain latch, nipple held in mouth throughout feeding, stimulation needed to elicit sucking reflex. Audible swallowing: 1 - A few with stimulation Type of nipple: 2 - Everted at rest and after stimulation Comfort: 1 - Filling, red/small blisters or bruises, mild/mod discomfort Hold: 1 - Assistance needed to correctly position  infant at breast and maintain latch LATCH score: 6 Latch assessment: Deep Lips flanged: No(needs upper lip flanged and lower lip widened) Suck assessment: Displays both   Pre-feed weight: 3038 grams/3046 grams post diaper change Post feed weight: 3060 grams/3056 grams Amount transferred: 22 ml + 10 ml Amount supplemented: 0  Additional Feeding Assessment: Infant oral assessment: Variance Infant oral assessment comment: see above Positioning: Football(left breast) Latch: 1 - Repeated attempts neede to sustain latch, nipple held in mouth throughout feeding, stimulation needed to elicit sucking reflex. Audible swallowing: 1 - A few with stimulation Type of nipple: 2 - Everted at rest and after stimulation Comfort: 1 - Filling, red/small blisters or bruises, mild/mod discomfort Hold: 2 - No assistance needed to correctly position infant at breast LATCH score: 7 Latch assessment: Deep Lips flanged: No(upper and lower lip needs flanging) Suck assessment: Displays both   Pre-feed weight: 3060 grams Post feed weight: 3078 grams Amount transferred: 18 ml Amount supplemented: 0  Totals: Total amount transferred: 50 ml Total supplement given: 0 Total amount pumped post feed: did  not pump   Plan:  1. Feed infant with feeding cues with no longer than 3 hours between feedings.     Infant needs at least 8 feedings in 24 hours.      Limit breast feeding to 30 minutes total until she is transferring better.      Count feedings from the beginning of one feeding to the beginning of the next feeding 2. Keep infant awake at the feeding as needed 3. Massage/intermittently compress breast with feedings when infant not feeding actively 4. Feed infant Skin to skin to help keep infant awake  5. Offer infant a bottle of pumped milk after breast feeding with a goal of about an ounce with each feeding, infant can have more if she wants it. Feed infant until she is satisfied. 6. When using a bottle,  would recommend you use a slower flow nipple such as a Dr. Theora GianottiBrown's 7. Feed infant using the Paced Bottle feeding method (video on [email or web address].[email or web address]) 8. Infant needs about 56-75 ml (2-2.5 ounces) for 8 feedings a day or 450-600 ml (15-20 ounces) in 24 hours. Infant may want more or less depending on how often the infant feeds. Feed infant until she is satisfied.  9. Continue pumping after breast feeding  7-8 x a day to use for supplement for infant and to protect milk supply 10. Keep up the good work 11. Thank you for allowing me to assist you today 12. Please call with any questions/concerns as needed [phone] 13. Follow up with Lactation in 1 week    Ed BlalockSharon S Joshlynn Alfonzo RN, IBCLC                                                          Ed BlalockSharon S Hadasah Brugger 06/13/2018, 11:49 AM

## 2018-06-20 ENCOUNTER — Ambulatory Visit: Payer: Self-pay

## 2018-06-20 NOTE — Lactation Note (Signed)
This note was copied from a baby's chart.  06/20/2018  Name: Candace SchimkeRielle Grace Mcintosh MRN: [id number] Date of Birth: 05/23/2020 Gestational Age: Gestational Age: 5567w6d Birth Weight: 118.3 oz Weight today:    7 pounds 1.6 ounces (3220 grams) with clean newborn diaper  974 week old infant presents today with mom and dad for follow up feeding assessment. Mom reports infant is feeding better. Mom is feeling overwhelmed with feeding plan. Infant prefers to be held vs put down. Mom is napping when infant is napping.   Infant has gained 182 grams in the last 7 days with an average daily weight gain of 26 grams a day. Infant is about 135 grams below birthweight and gaining has improved.   Infant latched to the right breast in the football hold. Infant difficult to latch due to tongue sucking. Mom was able to get infant latched. Infant sleepy at the breast, mom stimulated as needed. Infant was then latched to the left breast and fed for about 25 minutes and transferred 22 ml. Infant feeding finished with a bottle of pumped milk, infant tolerated it well.   Gave mom information on Fenugreek to help increase milk production. Advised mom to call OB prior to taking due to her thyroid issues.   Reviewed with mom and dad how to simplify plan as needed. Enc mom to pump some and let infant bottle feed to give mom a break as needed. Mom has good support from dad and her sister.   Follow up with Dr. Nash DimmerQuinlan Feb.10. [facility] Mom aware of BF Support Groups. Infant to follow up with Lactation in 2 weeks.   Mom and dad reports all questions/concerns have been answered. Mom and dad to call with questions /concerns as needed.     General Information: Mother's reason for visit: Follow up feeding assessment Consult: Follow-up Lactation consultant: Noralee StainSharon Azilee Pirro RN,IBCLC Breastfeeding experience: Latching with each feeding and supplementing with a bottle. Sleepy at the breast.  starting to gain Maternal medical conditions: Thyroid(Graves on Synthroid) Maternal medications: Pre-natal vitamin, Other(Fish Oil, Synthroid)  Breastfeeding History: Frequency of breast feeding: every 3 hours, parents are awakening to feed Duration of feeding: 10-40 minutes  Supplementation: Supplement method: bottle(Kiinde and Dr. Theora GianottiBrown's , paced bottle feeding) Brand: Similac Formula volume: 2 ounces Formula frequency: 1 x a day Total formula volume per day: 2 ounces Breast milk volume: 1-2 ounces Breast milk frequency: every 3 hours Total breast milk volume per day: 2 ounces Pump type: Medela pump in style Pump frequency: 7-8 x a day Pump volume: 3 ounces  Infant Output Assessment: Voids per 24 hours: 6+ Urine color: Clear yellow Stools per 24 hours: 3-4 Stool color: Yellow  Breast Assessment: Breast: Soft, Compressible Nipple: Erect Pain level: 1(5 with initial latch) Pain interventions: Bra, Coconut oil  Feeding Assessment: Infant oral assessment: Variance Infant oral assessment comment: Infant with thin labial frenulum that inserts at the bottom of the gum ridge. Upper lip tight with flanging, red ring noted around upper lip after feeding. infant needs upper lip flanged with feeding. Infant with tongue thrusting noted on breast and on gloved finger. infant tongue sucking, parents shown suck training exercises. infant with strong suckle and good tongue extension and cupping on gloved finger. Mom's nipples compressed post feeding.  Reviewed how tongue and lip restrictions can effect milk transfer and milk supply. Website Autom[employer] engineerinformation and Local Provider list given.  Positioning: Football(right breast) Latch: 1 - Repeated attempts needed to sustain  latch, nipple held in mouth throughout feeding, stimulation needed to elicit sucking reflex. Audible swallowing: 2 - Spontaneous and intermittent Type of nipple: 2 - Everted at rest and after stimulation Comfort: 1 - Filling,  red/small blisters or bruises, mild/mod discomfort Hold: 1 - Assistance needed to correctly position infant at breast and maintain latch LATCH score: 7 Latch assessment: Deep Lips flanged: No(upper lip needs flanging) Suck assessment: Displays both   Pre-feed weight: 3220 grams Post feed weight: 3254 grams Amount transferred: 34 ml Amount supplemented: 0  Additional Feeding Assessment: Infant oral assessment: Variance Infant oral assessment comment: see above Positioning: Football(left breast) Latch: 1 - Repeated attempts neede to sustain latch, nipple held in mouth throughout feeding, stimulation needed to elicit sucking reflex. Audible swallowing: 1 - A few with stimulation Type of nipple: 2 - Everted at rest and after stimulation Comfort: 1 - Filling, red/small blisters or bruises, mild/mod discomfort Hold: 1 - Assistance needed to correctly position infant at breast and maintain latch LATCH score: 6 Latch assessment: Deep Lips flanged: No(upper lip needs flanging) Suck assessment: Displays both   Pre-feed weight: 3254 grams Post feed weight: 3276 grams Amount transferred: 22 ml Amount supplemented: 30 ml  Totals: Total amount transferred: 56 ml Total supplement given: 30 ml EBM via Kiinde bottle Total amount pumped post feed: did not pump   Plan:  1. Feed infant with feeding cues with no longer than 3 hours between feedings.     Infant needs at least 8 feedings in 24 hours.      Limit breast feeding to 30 minutes total until she is transferring better.      Count feedings from the beginning of one feeding to the beginning of the next feeding     Perform suck training prior to offering the breast 2. Keep infant awake at the feeding as needed 3. Massage/intermittently compress breast with feedings when infant not feeding actively 4. Feed infant Skin to skin to help keep infant awake  5. Offer infant a bottle of pumped milk after breast feeding with a goal of about an  ounce with each feeding, infant can have more if she wants it. Feed infant until she is satisfied. 6. When using a bottle, would recommend you use a slower flow nipple such as a Dr. Theora Gianotti 7. Feed infant using the Paced Bottle feeding method (video on [email or web address].[email or web address]) 8. Infant needs about 60-80 ml (2-2.5 ounces) for 8 feedings a day or 480-640 ml (16-21 ounces) in 24 hours. Infant may want more or less depending on how often the infant feeds. Feed infant until she is satisfied.  9. Continue pumping after breast feeding  7-8 x a day to use for supplement for infant and to protect milk supply 10. Fenugreek may help boost your supply some. Talk with your OB/PCP before taking due to your thyroid issues.  Typical dosage is 610 mg Capsules taking 2-3 capsules 3 times a day 11. Keep up the good work 12. Thank you for allowing me to assist you today 13. Please call with any questions/concerns as needed [phone] 14. Follow up with Lactation in 2 weeks   Ed Blalock RN, [employer]  Candace Mcintosh 06/20/2018, 11:42 AM

## 2018-07-04 ENCOUNTER — Ambulatory Visit: Payer: Self-pay

## 2018-07-04 NOTE — Lactation Note (Signed)
This note was copied from a baby's chart. 07/04/2018  Name: Candace Mcintosh MRN: [id number] Date of Birth: 05/23/2020 Gestational Age: Gestational Age: [redacted]w[redacted]d Birth Weight: 118.3 oz Weight today:    8 pounds 0.8 ounces (3650 grams) with clean newborn diaper  56 week old infant presents today for follow up feeding assessment. Infant is BF and supplementing. Infant post lip release on 2/5 by Dr. Edrick Kins.   Infant has gained 430 grams in the last 14 days with an average daily weight gain of 31 grams a day. Infant is now 295 grams above birth weight. Parents pleased with infant weight gain.   Infant post lip release on 2/5 by Dr. Edrick Kins in [facility]). Dr. Edrick Kins did not feel infant needed tongue release.   Infant is self awakening for some feedings and parents awaken infant as needed at 3 hours. Discussed now that infant is over birthweight now, discussed infant can now awaken herself to feed with offering her supplement if she would like it. Enc mom to continue pumping 4-6 x a day to protect milk supply until we are sure infant is transferring well. Discussed we need to decrease supplement and pumping slowly until we are sure she is continuing to gain and transfer well. Infant is currently supplementing after most feedings. She needs about 2 ounces of formula a day.   Mom has started Fenugreek Jan. 29th. Mom is taking 1 capsule BID. Discussed usual dosage to 2-3 capsules TID is what is recommended to increase milk production.  Parents are performing suck training exercises as given at last assessment. Parents are performing stretches per Dr. Edrick Kins.   Infant does not open her mouth well with feeding. Mom has to work to get infant latched deeply with upper and lower lip flanging. Enc mom and dad to do some TMJ jaw massage before feeding. Enc mom to keep infant pulled in closer to the breast with feedings. Nipples compressed post feeding. Infant fed much better today and transferred a  good amount.   Infant with no scheduled follow up with Dr. Edrick Kins. Infant to follow up with Dr. Nash Dimmer on Feb.10. Infant to follow up with Lactation as needed, mom to call as needed. Infant has been followed up by [facility]. Mom to call with questions/concerns as needed.      General Information: Mother's reason for visit: Follow up feeding assessment Consult: Follow-up Lactation consultant: Noralee Stain RN,IBCLC Breastfeeding experience: Latching with each feeding and supplementing with a bottle. More active at the breast.  Maternal medical conditions: Thyroid(Graves on Synthroid) Maternal medications: Pre-natal vitamin, Other(fish Oil, Synthroid, Fenugreek 1 capsule BID x 1 week)  Breastfeeding History: Frequency of breast feeding: 6 x a day Duration of feeding: 20 minutes each side  Supplementation: Supplement method: bottle(Kiinde and Dr. Theora Gianotti, paced bottle feeding) Brand: Similac Formula volume: 2 ounces Formula frequency: 1-2 x a day Total formula volume per day: 2-4 ounces Breast milk volume: 1-2 ounces Breast milk frequency: every 3 hours Total breast milk volume per day: 8+ ounces Pump type: Medela pump in style Pump frequency: 6-8 x a day Pump volume: 3 ounces  Infant Output Assessment: Voids per 24 hours: 6-8 Urine color: Clear yellow Stools per 24 hours: 1-3 Stool color: Yellow  Breast Assessment: Breast: Soft, Compressible Nipple: Erect Pain level: 1(mom reports 4 with initial latch) Pain interventions: Bra, Hydrogel dressing  Feeding Assessment: Infant oral assessment: Variance Infant oral assessment comment: Infant with granulation tissue to upper lip. Upper lip flanges better  today although still tight on the breast at times. Infant with some tongue thrusting with initial latch and takes a while to latch at times. infant does not open wide to latch. Infant with strong suckle on gloved finger with good tongue extension and cupping on gloved  finger. Mom's nipples slightly compressed post feeding but less than last assessment. infant did drool quite a bit with BF today, mom reports she generally does not see this with feedings.  Positioning: Football(right breast, 20 minutes) Latch: 1 - Repeated attempts needed to sustain latch, nipple held in mouth throughout feeding, stimulation needed to elicit sucking reflex. Audible swallowing: 2 - Spontaneous and intermittent Type of nipple: 2 - Everted at rest and after stimulation Comfort: 1 - Filling, red/small blisters or bruises, mild/mod discomfort Hold: 2 - No assistance needed to correctly position infant at breast LATCH score: 8 Latch assessment: Deep Lips flanged: No(upper and lower lip needs flanging after latch) Suck assessment: Displays both   Pre-feed weight: 3650 grams Post feed weight: 3700 grams Amount transferred: 50 ml Amount supplemented: 0  Additional Feeding Assessment: Infant oral assessment: Variance Infant oral assessment comment: see above Positioning: Football(left breast) Latch: 2 - Grasps breast easily, tongue down, lips flanged, rhythmical sucking. Audible swallowing: 2 - Spontaneous and intermittent Type of nipple: 2 - Everted at rest and after stimulation Comfort: 1 - Filling, red/small blisters or bruises, mild/mod discomfort Hold: 1 - Assistance needed to correctly position infant at breast and maintain latch LATCH score: 8 Latch assessment: Deep Lips flanged: No(upper and lower lip needs flanging) Suck assessment: Displays both   Pre-feed weight: 3700 grams Post feed weight: 3744 grams Amount transferred: 44 ml Amount supplemented: 0  Totals: Total amount transferred: 94 ml Total supplement given: 0 Total amount pumped post feed: did not pump   Plan:  1. Feed infant with feeding cues, can allow infant to awaken to feed on her own. Infant needs at least 7-8 feedings in 24 hours.  Limit breast feeding to 30 minutes total until she  is transferring better.  Count feedings from the beginning of one feeding to the beginning of the next feeding     Perform suck training prior to offering the breast 2. Keep infant awake at the feeding as needed 3. Massage/intermittently compress breast with feedings when infant not feeding actively 4. Feed infant Skin to skin to help keep infant awake  5. Offer infant a bottle of pumped milk after breast feeding if she is still cueing to feed,  infant can have more if she wants it. Feed infant until she is satisfied. 6. When using a bottle, would recommend you use a slower flow nipple such as a Dr. Theora GianottiBrown's 7. Feed infant using the Paced Bottle feeding method (video on [email or web address].[email or web address]) 8. Infant needs about 68-90 ml (2.5-3 ounces) for 8 feedings a day or  ml (18-24 ounces) in 24 hours. Infant may want more or less depending on how often the infant feeds. Feed infant until she is satisfied.  9. Continue pumping after breast feeding4-6 x a day to use for supplement for infant and to protect milk supply 10. Fenugreek may help boost your supply some. Talk with your OB/PCP before taking due to your thyroid issues.  Typical dosage is 610 mg Capsules taking 2-3 capsules 3 times a day 11. Continue stretches per Dr. Edrick KinsMensah, continue suck training as you have been  12. Keep up the good work 13. Thank you for allowing me to assist you today  14. Please call with any questions/concerns as needed [phone]) [phone] 15. Follow up with Lactation as needed  Ed BlalockSharon S Charlann Wayne RN, IBCLC                                                         Silas FloodSharon S Alahna Dunne 07/04/2018, 11:57 AM

## 2019-03-31 ENCOUNTER — Other Ambulatory Visit: Payer: Self-pay | Admitting: Family Medicine

## 2019-03-31 ENCOUNTER — Other Ambulatory Visit (HOSPITAL_COMMUNITY)
Admission: RE | Admit: 2019-03-31 | Discharge: 2019-03-31 | Disposition: A | Discharge status: Home / Self Care | Payer: BC Managed Care – PPO | Source: Ambulatory Visit | Attending: Family Medicine | Admitting: Family Medicine

## 2019-03-31 DIAGNOSIS — Z0000 Encounter for general adult medical examination without abnormal findings: Secondary | ICD-10-CM | POA: Insufficient documentation

## 2019-04-03 LAB — CYTOLOGY - PAP
Comment: NEGATIVE
Diagnosis: UNDETERMINED — AB
High risk HPV: NEGATIVE

## 2019-05-30 NOTE — L&D Delivery Note (Signed)
Delivery Note:   G2P1001 at [redacted]w[redacted]d  Admitting diagnosis: Intrauterine normal pregnancy [Z34.90] Risks:  1) GBS positive- PCN in labor 2) O neg- s/p RhoGAM 3) Abnormal quad- AFP elevated 1:187 Followed by serial [place]- last [place] [redacted]w[redacted]d- vertex/EFW 5#12oz (60%) 4)Hypothyroidism- currently on synthroid daily  Onset of labor: 1900 IOL/Augmentation: Pitocin ROM: 0930 clear AF  Complete dilation at 02/17/2020  2221 Onset of pushing at 2221 FHR second stage Cat 1  Analgesia /Anesthesia intrapartum:Local  Pushing in hands/knees position with CNM and L&D staff support, FOB and doula Latriece present for birth and supportive.  Delivery of a Live born female  Birth Weight:  3396 g Weight:, English: 7 lb 7.8 oz APGAR: 9, 9  Newborn Delivery   Birth date/time: 02/17/2020 22:35:00 Delivery type: Vaginal, Spontaneous      in cephalic presentation, position OA to ROT.  Nuchal Cord: Yes x 1, loose Cord double clamped after cessation of pulsation, cut by FOB.  Collection of cord blood for typing completed. Cord blood donation-None  Arterial cord blood sample-No    Placenta delivered-Spontaneous  with 3 vessels . Uterotonics: Pitocin IV bolus Placenta to L&D for disposal. Uterine tone firm, bleeding small  1st degree  perineal and R perilabial laceration identified.  Episiotomy:None  Local analgesia: 1%lido  Repair: 3.0 vicryl in standard fashion, good hemostasis Est. Blood Loss (mL):50.00   Complications: None   Mom to postpartum.  Baby to Couplet care / Skin to Skin.  Delivery Report:  Review the Delivery Report for details.     Signed: Neta Mends, CNM, MSN 02/17/2020, 11:21 PM

## 2019-07-16 LAB — OB RESULTS CONSOLE RPR: RPR: NONREACTIVE

## 2019-07-16 LAB — OB RESULTS CONSOLE RUBELLA ANTIBODY, IGM: Rubella: IMMUNE

## 2019-07-16 LAB — OB RESULTS CONSOLE HEPATITIS B SURFACE ANTIGEN: Hepatitis B Surface Ag: NEGATIVE

## 2019-07-16 LAB — OB RESULTS CONSOLE HIV ANTIBODY (ROUTINE TESTING): HIV: NONREACTIVE

## 2019-10-08 ENCOUNTER — Other Ambulatory Visit: Payer: Self-pay | Admitting: Obstetrics and Gynecology

## 2019-10-15 ENCOUNTER — Other Ambulatory Visit: Payer: Self-pay | Admitting: Obstetrics & Gynecology

## 2019-10-15 DIAGNOSIS — R772 Abnormality of alphafetoprotein: Secondary | ICD-10-CM

## 2019-10-21 ENCOUNTER — Encounter: Payer: Self-pay | Admitting: *Deleted

## 2019-10-23 ENCOUNTER — Ambulatory Visit (HOSPITAL_BASED_OUTPATIENT_CLINIC_OR_DEPARTMENT_OTHER): Payer: BC Managed Care – PPO | Admitting: Obstetrics and Gynecology

## 2019-10-23 ENCOUNTER — Ambulatory Visit: Payer: BC Managed Care – PPO | Attending: Obstetrics and Gynecology

## 2019-10-23 ENCOUNTER — Ambulatory Visit: Payer: BC Managed Care – PPO | Admitting: *Deleted

## 2019-10-23 ENCOUNTER — Other Ambulatory Visit: Payer: Self-pay

## 2019-10-23 VITALS — BP 120/72 | HR 87

## 2019-10-23 DIAGNOSIS — R772 Abnormality of alphafetoprotein: Secondary | ICD-10-CM

## 2019-10-23 DIAGNOSIS — O99280 Endocrine, nutritional and metabolic diseases complicating pregnancy, unspecified trimester: Secondary | ICD-10-CM | POA: Insufficient documentation

## 2019-10-23 DIAGNOSIS — O09512 Supervision of elderly primigravida, second trimester: Secondary | ICD-10-CM | POA: Insufficient documentation

## 2019-10-23 DIAGNOSIS — O99282 Endocrine, nutritional and metabolic diseases complicating pregnancy, second trimester: Secondary | ICD-10-CM

## 2019-10-23 DIAGNOSIS — Z361 Encounter for antenatal screening for raised alphafetoprotein level: Secondary | ICD-10-CM

## 2019-10-23 DIAGNOSIS — Z363 Encounter for antenatal screening for malformations: Secondary | ICD-10-CM | POA: Diagnosis not present

## 2019-10-23 DIAGNOSIS — E039 Hypothyroidism, unspecified: Secondary | ICD-10-CM

## 2019-10-23 DIAGNOSIS — Z3A22 22 weeks gestation of pregnancy: Secondary | ICD-10-CM | POA: Diagnosis not present

## 2019-10-23 NOTE — Progress Notes (Signed)
Maternal-Fetal Medicine   Name: Candace Mcintosh DOB: 05/28/1986 MRN: [id number] Referring Provider: Myna Hidalgo, MD  I had the pleasure of seeing Cand[facility] today at the [facility].  She is G2 P1001 at 22w 5d gestation and is here for ultrasound and consultation. On serum screening, the risk for open neural tube defect was increased at 1 in 187.  MSAFP was increased at 3.14 MoM.  Her pregnancy is well dated by her sure LMP date that is consistent with early ultrasound measurements.  She does not give history of vaginal bleeding in this pregnancy. On cell free fetal DNA screening, the risks of fetal aneuploidies are not increased.  Past medical history: Hypothyroidism (surgical).  Patient takes Synthroid.  She does not have hypertension or diabetes or sickle cell trait. Past surgical history: Thyroid ablation in 2004. Medications Synthroid 175 mcg daily, prenatal vitamins. Allergies: No known drug allergies. Social history: Denies tobacco drug or alcohol use.  She is married and her husband is African-American who is also the father of her first child.  He is in good health. Family history: No history of venous thromboembolism in the family.  Obstetric history is significant for a term vaginal delivery in 2019 of a female infant weighing 7 pounds 6 ounces at birth.  Her daughter is in good health. GYN history: No history of abnormal Pap smears or cervical surgeries.  We performed fetal anatomy scan.  No markers of aneuploidies or fetal structural defects are seen.  Fetal biometry is consistent with her previously established dates.  Amniotic fluid is normal and good fetal activity seen.  Intracranial structures appear normal.  Fetal spine appears normal.  Abdominal cord insertion site is normal.  I counseled the patient on the following: Increased risk for open-neural tube defects and increased AFP:  I reassured the patient of normal fetal anatomy on ultrasound  including spine and intracranial structures. I informed her that ultrasound can detect up to 95 out of 100 cases of spina bifida and that amniocentesis will only marginally improve the detection rate. Other causes of increased MSAFP include structural anomalies such as omphalocele and gastroschisis both of which are easy to identify on ultrasound. Increased AFP can also be associated with fetal growth restriction (placental insufficiency), preterm delivery and stillbirth (very rare). I recommended serial fetal growth assessments. It can also follow vaginal bleeding. Patient does not give history of vaginal bleeding.  I informed the couple that they can also meet with our genetic counselor.  Advanced maternal age (AMA): I informed that AMA is associated with an increase in fetal aneuploidies. I discussed available screening methods. I discussed the significance of cell-free fetal DNA screening that analyzes fetal DNA in maternal blood for trisomies 21, 18 and 13, and, which has a greater detection rate than conventional screening tests. I informed the patient that not all chromosomal malformations are detected by this test. I informed her that only invasive tests including amniocentesis will give a definitive result on the fetal karyotype. I informed her that pregnancy loss from the procedure is about 1 in 500. She opted not to have amniocentesis today but will discuss with her husband.  Patient informed that she would discuss with her husband at home and decide on meeting with our genetic counselor.  Thank you for consultation.  Please contact me at the [facility] if you have any questions. Consultation including face-to-face counseling: 45 minutes.

## 2019-10-24 ENCOUNTER — Other Ambulatory Visit: Payer: Self-pay | Admitting: *Deleted

## 2019-10-24 DIAGNOSIS — O280 Abnormal hematological finding on antenatal screening of mother: Secondary | ICD-10-CM

## 2019-11-20 ENCOUNTER — Ambulatory Visit: Payer: BC Managed Care – PPO

## 2020-01-21 LAB — OB RESULTS CONSOLE GBS: GBS: POSITIVE

## 2020-02-17 ENCOUNTER — Other Ambulatory Visit: Payer: Self-pay

## 2020-02-17 ENCOUNTER — Inpatient Hospital Stay (HOSPITAL_COMMUNITY)
Admission: AD | Admit: 2020-02-17 | Discharge: 2020-02-19 | DRG: 807 | Disposition: A | Discharge status: Home / Self Care | Payer: BC Managed Care – PPO | Attending: Obstetrics & Gynecology | Admitting: Obstetrics & Gynecology

## 2020-02-17 ENCOUNTER — Encounter (HOSPITAL_COMMUNITY): Payer: Self-pay | Admitting: Obstetrics & Gynecology

## 2020-02-17 DIAGNOSIS — Z3A39 39 weeks gestation of pregnancy: Secondary | ICD-10-CM

## 2020-02-17 DIAGNOSIS — Z20822 Contact with and (suspected) exposure to covid-19: Secondary | ICD-10-CM | POA: Diagnosis present

## 2020-02-17 DIAGNOSIS — O26893 Other specified pregnancy related conditions, third trimester: Secondary | ICD-10-CM | POA: Diagnosis present

## 2020-02-17 DIAGNOSIS — O99824 Streptococcus B carrier state complicating childbirth: Secondary | ICD-10-CM | POA: Diagnosis present

## 2020-02-17 DIAGNOSIS — Z6791 Unspecified blood type, Rh negative: Secondary | ICD-10-CM

## 2020-02-17 DIAGNOSIS — E039 Hypothyroidism, unspecified: Secondary | ICD-10-CM | POA: Diagnosis present

## 2020-02-17 DIAGNOSIS — O4292 Full-term premature rupture of membranes, unspecified as to length of time between rupture and onset of labor: Secondary | ICD-10-CM | POA: Diagnosis present

## 2020-02-17 DIAGNOSIS — O99284 Endocrine, nutritional and metabolic diseases complicating childbirth: Secondary | ICD-10-CM | POA: Diagnosis present

## 2020-02-17 DIAGNOSIS — Z3490 Encounter for supervision of normal pregnancy, unspecified, unspecified trimester: Secondary | ICD-10-CM

## 2020-02-17 DIAGNOSIS — O26899 Other specified pregnancy related conditions, unspecified trimester: Secondary | ICD-10-CM

## 2020-02-17 DIAGNOSIS — O99280 Endocrine, nutritional and metabolic diseases complicating pregnancy, unspecified trimester: Secondary | ICD-10-CM | POA: Diagnosis present

## 2020-02-17 LAB — TYPE AND SCREEN
ABO/RH(D): O NEG
Antibody Screen: NEGATIVE

## 2020-02-17 LAB — CBC
HCT: 35.8 % — ABNORMAL LOW (ref 36.0–46.0)
Hemoglobin: 11.7 g/dL — ABNORMAL LOW (ref 12.0–15.0)
MCH: 30.9 pg (ref 26.0–34.0)
MCHC: 32.7 g/dL (ref 30.0–36.0)
MCV: 94.5 fL (ref 80.0–100.0)
Platelets: 172 10*3/uL (ref 150–400)
RBC: 3.79 MIL/uL — ABNORMAL LOW (ref 3.87–5.11)
RDW: 13.8 % (ref 11.5–15.5)
WBC: 8.1 10*3/uL (ref 4.0–10.5)
nRBC: 0 % (ref 0.0–0.2)

## 2020-02-17 LAB — POCT FERN TEST: POCT Fern Test: POSITIVE

## 2020-02-17 LAB — SARS CORONAVIRUS 2 BY RT PCR (HOSPITAL ORDER, PERFORMED IN ~~LOC~~ HOSPITAL LAB): SARS Coronavirus 2: NEGATIVE

## 2020-02-17 MED ORDER — SOD CITRATE-CITRIC ACID 500-334 MG/5ML PO SOLN: 30.0000 mL | ORAL | Status: DC | PRN

## 2020-02-17 MED ORDER — OXYCODONE-ACETAMINOPHEN 5-325 MG PO TABS: 2.0000 | ORAL_TABLET | ORAL | Status: DC | PRN

## 2020-02-17 MED ORDER — LACTATED RINGERS IV SOLN: INTRAVENOUS | Status: DC

## 2020-02-17 MED ORDER — ACETAMINOPHEN 325 MG PO TABS: 650.0000 mg | ORAL_TABLET | ORAL | Status: DC | PRN

## 2020-02-17 MED ORDER — TERBUTALINE SULFATE 1 MG/ML IJ SOLN: 0.2500 mg | Freq: Once | INTRAMUSCULAR | Status: DC | PRN

## 2020-02-17 MED ORDER — SODIUM CHLORIDE 0.9 % IV SOLN
5.0000 10*6.[IU] | Freq: Once | INTRAVENOUS | Status: AC
  Administered 2020-02-17: 5 10*6.[IU] via INTRAVENOUS
  Filled 2020-02-17: qty 5

## 2020-02-17 MED ORDER — OXYTOCIN-SODIUM CHLORIDE 30-0.9 UT/500ML-% IV SOLN
1.0000 m[IU]/min | INTRAVENOUS | Status: DC
  Administered 2020-02-17: 2 m[IU]/min via INTRAVENOUS

## 2020-02-17 MED ORDER — ONDANSETRON HCL 4 MG/2ML IJ SOLN: 4.0000 mg | Freq: Four times a day (QID) | INTRAMUSCULAR | Status: DC | PRN

## 2020-02-17 MED ORDER — LIDOCAINE HCL (PF) 1 % IJ SOLN
30.0000 mL | INTRAMUSCULAR | Status: AC | PRN
  Administered 2020-02-17: 30 mL via SUBCUTANEOUS
  Filled 2020-02-17: qty 30

## 2020-02-17 MED ORDER — OXYTOCIN-SODIUM CHLORIDE 30-0.9 UT/500ML-% IV SOLN
2.5000 [IU]/h | INTRAVENOUS | Status: DC
  Administered 2020-02-17: 2.5 [IU]/h via INTRAVENOUS
  Filled 2020-02-17: qty 500

## 2020-02-17 MED ORDER — FENTANYL CITRATE (PF) 100 MCG/2ML IJ SOLN: 50.0000 ug | INTRAMUSCULAR | Status: DC | PRN

## 2020-02-17 MED ORDER — PENICILLIN G POT IN DEXTROSE 60000 UNIT/ML IV SOLN: 3.0000 10*6.[IU] | INTRAVENOUS | Status: DC

## 2020-02-17 MED ORDER — OXYTOCIN BOLUS FROM INFUSION
333.0000 mL | Freq: Once | INTRAVENOUS | Status: AC
  Administered 2020-02-17: 333 mL via INTRAVENOUS

## 2020-02-17 MED ORDER — OXYCODONE-ACETAMINOPHEN 5-325 MG PO TABS: 1.0000 | ORAL_TABLET | ORAL | Status: DC | PRN

## 2020-02-17 MED ORDER — LACTATED RINGERS IV SOLN: 500.0000 mL | INTRAVENOUS | Status: DC | PRN

## 2020-02-17 NOTE — H&P (Signed)
HPI: 34 y/o G2P1001 @ [redacted]w[redacted]d estimated gestational age (as dated by LMP c/w 20 week ultrasound) presents complaining of leaking fluid since 9:30 this am   no Vaginal Bleeding,   irregular Uterine Contractions,  + Fetal Movement.  Prenatal care has been provided by Dr. Charlotta Newton  ROS: no HA, no epigastric pain, no visual changes.    Pregnancy complicated by: 1) GBS positive- PCN in labor 2) O neg- s/p RhoGAM 3) Abnormal quad- AFP elevated 1:187 Followed by serial [place]- last [place] [redacted]w[redacted]d- vertex/EFW 5#12oz (60%) 4)Hypothyroidism- currently on synthroid daily  Prenatal Transfer Tool  Maternal Diabetes: No Genetic Screening: Abnormal:  Results: Elevated AFP Maternal Ultrasounds/Referrals: Normal Fetal Ultrasounds or other Referrals:  Referred to Materal Fetal Medicine  Maternal Substance Abuse:  No Significant Maternal Medications:  Meds include: Syntroid Significant Maternal Lab Results: Group B Strep positive   PNL:  GBS positive, Rub Immune, Hep B neg, RPR NR, HIV neg, GC/C neg, glucola:96 Hgb: 12.2 Blood type: O neg, antibody neg  Immunizations: Tdap: 12/18/19  OBHx: 04/2018- FT VAVD- 7#6oz PMHx:  hypothyroidism Meds:  PNV, Synthroid Allergy:   Allergies  Allergen Reactions  . Selsun Blue [Selenium Sulfide]    SurgHx: none SocHx:   no Tobacco, no  EtOH, no Illicit Drugs  O: BP 115/73   Pulse 70   Temp 97.9 F (36.6 C) (Oral)   Resp 17   Ht 5\' 7"  (1.702 m)   Wt 104.8 kg   LMP 05/24/2019   SpO2 98%   BMI 36.18 kg/m  Gen. AAOx3, NAD CV.  RRR  No murmur.  Resp. CTAB, no wheeze or crackles. Abd. Gravid,  no tenderness,  no rigidity,  no guarding Extr.  no edema B/L , no calf tenderness, neg Homan's B/L FHT: 130 baseline, moderate variability, + accels,  no decels Toco: irregular SVE: deferred, per MAU 3cm  Vertex by bedside 05/26/2019  Labs: see orders  A/P:  34 y.o. G2P1001 @ [redacted]w[redacted]d EGA who presents for PROM -FWB:  NICHD Cat I FHTs -Labor: plan to start Pit per  protocol -GBS: positive- plan for PCN per protocol -Pain management: IV or epidural upon request -Hypothyroidism: continue Synthroid [redacted]w[redacted]d daily  , DO [phone] (cell) [phone] (office)

## 2020-02-17 NOTE — Progress Notes (Signed)
Candace Mcintosh is a 34 y.o. G2P1001 at [redacted]w[redacted]d by ultrasound admitted for PROM in latent labor for augmentation.  Subjective: Reports painful ctx and now pelvic pressure w/ ctx, desires exam and possible epidural if not transitioning.   Objective: Vitals:   02/17/20 1901 02/17/20 1954 02/17/20 2036 02/17/20 2203  BP: 117/79 117/74 (!) 138/91   Pulse: 76 71 76   Resp: 18 16 18    Temp:  98.8 F (37.1 C)  98.4 F (36.9 C)  TempSrc:  Oral  Oral  SpO2:      Weight:      Height:         FHT:  FHR: 140 bpm, variability: moderate,  accelerations:  Present,  decelerations:  Present small variables UC:   regular, every 2-3 minutes SVE:   Dilation: 10 Effacement (%): 100 Station: 0 Exam by:: [email or web address], CNM Vertex  Labs:   Recent Labs    02/17/20 1801  WBC 8.1  HGB 11.7*  HCT 35.8*  PLT 172    Assessment / Plan: G2P1001 34 y.o. [redacted]w[redacted]d Augmentation of labor, progressing well Anticipate active 2nd stage, defer epidural  Labor: DC pitocin and allow for physiologic labor and spont. pushing Preeclampsia:  no signs or symptoms of toxicity Fetal Wellbeing:  Category I Pain Control:  Labor support without medications I/D:  GBSGBS positive, one dose ABX Anticipated MOD:  NSVD  [redacted]w[redacted]d, CNM, MSN 02/17/2020, 10:26 PM

## 2020-02-17 NOTE — MAU Note (Signed)
Pt c/o leaking of fluid starting today 0930 in the morning. Pt reports normal fetal movement. Pt denies contractions and bleeding. Pt denies problems with this pregnancy.

## 2020-02-18 ENCOUNTER — Encounter (HOSPITAL_COMMUNITY): Payer: Self-pay | Admitting: Obstetrics & Gynecology

## 2020-02-18 LAB — CBC
HCT: 33.4 % — ABNORMAL LOW (ref 36.0–46.0)
Hemoglobin: 11.2 g/dL — ABNORMAL LOW (ref 12.0–15.0)
MCH: 31.5 pg (ref 26.0–34.0)
MCHC: 33.5 g/dL (ref 30.0–36.0)
MCV: 94.1 fL (ref 80.0–100.0)
Platelets: 172 10*3/uL (ref 150–400)
RBC: 3.55 MIL/uL — ABNORMAL LOW (ref 3.87–5.11)
RDW: 13.7 % (ref 11.5–15.5)
WBC: 12.3 10*3/uL — ABNORMAL HIGH (ref 4.0–10.5)
nRBC: 0 % (ref 0.0–0.2)

## 2020-02-18 LAB — RPR: RPR Ser Ql: NONREACTIVE

## 2020-02-18 MED ORDER — IBUPROFEN 600 MG PO TABS
600.0000 mg | ORAL_TABLET | Freq: Four times a day (QID) | ORAL | 0 refills | Status: DC
Start: 2020-02-19 — End: 2023-02-11

## 2020-02-18 MED ORDER — ONDANSETRON HCL 4 MG/2ML IJ SOLN: 4.0000 mg | INTRAMUSCULAR | Status: DC | PRN

## 2020-02-18 MED ORDER — ZOLPIDEM TARTRATE 5 MG PO TABS: 5.0000 mg | ORAL_TABLET | Freq: Every evening | ORAL | Status: DC | PRN

## 2020-02-18 MED ORDER — BENZOCAINE-MENTHOL 20-0.5 % EX AERO
1.0000 "application " | INHALATION_SPRAY | CUTANEOUS | Status: DC | PRN
  Filled 2020-02-18: qty 56

## 2020-02-18 MED ORDER — IBUPROFEN 600 MG PO TABS
600.0000 mg | ORAL_TABLET | Freq: Four times a day (QID) | ORAL | Status: DC
  Administered 2020-02-18 – 2020-02-19 (×6): 600 mg via ORAL
  Filled 2020-02-18 (×5): qty 1

## 2020-02-18 MED ORDER — COCONUT OIL OIL: 1.0000 "application " | TOPICAL_OIL | Status: DC | PRN

## 2020-02-18 MED ORDER — BISACODYL 10 MG RE SUPP: 10.0000 mg | Freq: Every day | RECTAL | Status: DC | PRN

## 2020-02-18 MED ORDER — SENNOSIDES-DOCUSATE SODIUM 8.6-50 MG PO TABS
2.0000 | ORAL_TABLET | ORAL | Status: DC
  Administered 2020-02-18: 2 via ORAL

## 2020-02-18 MED ORDER — TETANUS-DIPHTH-ACELL PERTUSSIS 5-2.5-18.5 LF-MCG/0.5 IM SUSP: 0.5000 mL | Freq: Once | INTRAMUSCULAR | Status: DC

## 2020-02-18 MED ORDER — DIPHENHYDRAMINE HCL 25 MG PO CAPS: 25.0000 mg | ORAL_CAPSULE | Freq: Four times a day (QID) | ORAL | Status: DC | PRN

## 2020-02-18 MED ORDER — WITCH HAZEL-GLYCERIN EX PADS: 1.0000 "application " | MEDICATED_PAD | CUTANEOUS | Status: DC | PRN

## 2020-02-18 MED ORDER — ACETAMINOPHEN 500 MG PO TABS
1000.0000 mg | ORAL_TABLET | Freq: Four times a day (QID) | ORAL | Status: DC
  Administered 2020-02-18 – 2020-02-19 (×5): 1000 mg via ORAL
  Filled 2020-02-18 (×5): qty 2

## 2020-02-18 MED ORDER — ONDANSETRON HCL 4 MG PO TABS: 4.0000 mg | ORAL_TABLET | ORAL | Status: DC | PRN

## 2020-02-18 MED ORDER — PRENATAL MULTIVITAMIN CH
1.0000 | ORAL_TABLET | Freq: Every day | ORAL | Status: DC
  Administered 2020-02-18: 1 via ORAL
  Filled 2020-02-18 (×2): qty 1

## 2020-02-18 MED ORDER — DIBUCAINE (PERIANAL) 1 % EX OINT: 1.0000 "application " | TOPICAL_OINTMENT | CUTANEOUS | Status: DC | PRN

## 2020-02-18 MED ORDER — LEVOTHYROXINE SODIUM 75 MCG PO TABS
175.0000 ug | ORAL_TABLET | Freq: Every day | ORAL | Status: DC
  Administered 2020-02-18 – 2020-02-19 (×2): 175 ug via ORAL
  Filled 2020-02-18 (×2): qty 1

## 2020-02-18 MED ORDER — FLEET ENEMA 7-19 GM/118ML RE ENEM: 1.0000 | ENEMA | Freq: Every day | RECTAL | Status: DC | PRN

## 2020-02-18 MED ORDER — SIMETHICONE 80 MG PO CHEW: 80.0000 mg | CHEWABLE_TABLET | ORAL | Status: DC | PRN

## 2020-02-18 MED ORDER — RHO D IMMUNE GLOBULIN 1500 UNIT/2ML IJ SOSY
300.0000 ug | PREFILLED_SYRINGE | Freq: Once | INTRAMUSCULAR | Status: AC
  Administered 2020-02-18: 300 ug via INTRAVENOUS
  Filled 2020-02-18: qty 2

## 2020-02-18 NOTE — Discharge Instructions (Signed)

## 2020-02-18 NOTE — Lactation Note (Signed)
This note was copied from a baby's chart. Lactation Consultation Note  Patient Name: Candace Mcintosh KZLDJ'T Date: 02/18/2020 Reason for consult: Initial assessment  Infant in nursery for circ. At the time of LC visit.  [name] is able to hand express.  LC reviewed with her.  She successfully breast fed her 22 month old for 14 months.    BF basics reviewed:  BF 8-12times + in 24 hours, feed on demand, STS, good positioning and support.  Lactation brochure provided.  Mom is aware of support groups and phone line as well as OP LC appt.  She was involved with support groups and saw [name] for OP LC appts. With her first child.      Maternal Data Has patient been taught Hand Expression?: Yes Does the patient have breastfeeding experience prior to this delivery?: Yes  Feeding Feeding Type: Breast Fed  LATCH Score Latch: Repeated attempts needed to sustain latch, nipple held in mouth throughout feeding, stimulation needed to elicit sucking reflex.  Audible Swallowing: A few with stimulation  Type of Nipple: Everted at rest and after stimulation  Comfort (Breast/Nipple): Soft / non-tender  Hold (Positioning): No assistance needed to correctly position infant at breast.  LATCH Score: 8  Interventions Interventions: Breast feeding basics reviewed;Hand express  Lactation Tools Discussed/Used     Consult Status Consult Status: Follow-up Date: 02/19/20 Follow-up type: In-patient    Maryruth Hancock [facility] 02/18/2020, 6:16 PM

## 2020-02-18 NOTE — Progress Notes (Signed)
Postpartum Note Day # 1  S:  Patient resting comfortable in bed.  Pain controlled.  Tolerating general diet. Lochia moderate.  Ambulating without difficulty.  She denies n/v/f/c, SOB, or CP.  Pt plans on breastfeeding.  O: Temp:  [97.9 F (36.6 C)-98.9 F (37.2 C)] 98.4 F (36.9 C) (09/22 0559) Pulse Rate:  [68-76] 75 (09/22 0559) Resp:  [16-18] 18 (09/22 0559) BP: (104-139)/(58-91) 111/61 (09/22 0559) SpO2:  [98 %-99 %] 99 % (09/22 0110) Weight:  [104.8 kg] 104.8 kg (09/21 1710)   Gen: A&Ox3, NAD CV: RRR Resp: CTAB Abdomen: soft, NT, ND Uterus: firm, non-tender, below umbilicus Ext: No edema, no calf tenderness bilaterally  Labs: Recent Labs    02/17/20 1801 02/18/20 0500  HGB 11.7* 11.2*    A/P: Pt is a 34 y.o. [id number] s/p NSVD, PPD#1  - Pain well controlled -GU: voiding freely -GI: Tolerating general diet -Activity: encouraged sitting up to chair and ambulation as tolerated -Prophylaxis: early ambulation -Labs: stable as above -Baby boy circ to be completed later today  Myna Hidalgo, DO [phone] (cell) [phone] (office)

## 2020-02-19 LAB — RH IG WORKUP (INCLUDES ABO/RH)
ABO/RH(D): O NEG
Fetal Screen: NEGATIVE
Gestational Age(Wks): 39.3
Unit division: 0

## 2020-02-19 NOTE — Discharge Summary (Signed)
   Postpartum Discharge Summary    Patient Name: Candace Mcintosh DOB: 07/30/1985 MRN: [id number]  Date of admission: 02/17/2020 Delivery date:02/17/2020  Delivering provider: PAUL, DANIELA C  Date of discharge: 02/19/2020  Admitting diagnosis: Intrauterine normal pregnancy [Z34.90] Intrauterine pregnancy: [redacted]w[redacted]d     Secondary diagnosis:  Principal Problem:   Postpartum care following vaginal delivery 9/21 Active Problems:   Hypothyroidism affecting pregnancy   Intrauterine normal pregnancy   Rh negative, maternal   SVD (spontaneous vaginal delivery)   Perineal laceration with delivery, first degree  Additional problems: Hypothyroidism    Discharge diagnosis: Term Pregnancy Delivered                                              Post partum procedures:n/a Augmentation: Pitocin Complications: None  Hospital course: Onset of Labor With Vaginal Delivery      34 y.o. yo G2P2002 at [redacted]w[redacted]d was admitted in Latent Labor on 02/17/2020. Patient had an uncomplicated labor course as follows:  Membrane Rupture Time/Date: 9:30 AM ,02/17/2020   Delivery Method:Vaginal, Spontaneous  Episiotomy: None  Lacerations:  1st degree  Patient had an uncomplicated postpartum course.  She is ambulating, tolerating a regular diet, passing flatus, and urinating well. Patient is discharged home in stable condition on 02/19/20.  Newborn Data: Birth date:02/17/2020  Birth time:10:35 PM  Gender:Female  Living status:Living  Apgars:9 ,9  Weight:3396 g   Magnesium Sulfate received: No BMZ received: No Rhophylac:Yes MMR:No T-DaP:Given prenatally Flu: N/A Transfusion:No  Physical exam  Vitals:   02/18/20 0559 02/18/20 1428 02/18/20 1942 02/19/20 0549  BP: 111/61 121/73 118/63 97/71  Pulse: 75 80 83 63  Resp: 18 18 18   Temp: 98.4 F (36.9 C) 99 F (37.2 C) 98.2 F (36.8 C) 97.9 F (36.6 C)  TempSrc: Oral Oral Oral   SpO2:  98% 97%   Weight:      Height:       General: alert, cooperative and no  distress Lochia: appropriate Uterine Fundus: firm Incision: N/A DVT Evaluation: No evidence of DVT seen on physical exam. Labs: Lab Results  Component Value Date   WBC 12.3 (H) 02/18/2020   HGB 11.2 (L) 02/18/2020   HCT 33.4 (L) 02/18/2020   MCV 94.1 02/18/2020   PLT 172 02/18/2020   No flowsheet data found. Edinburgh Score: Edinburgh Postnatal Depression Scale Screening Tool 02/18/2020  I have been able to laugh and see the funny side of things. 0  I have looked forward with enjoyment to things. 0  I have blamed myself unnecessarily when things went wrong. 1  I have been anxious or worried for no good reason. 1  I have felt scared or panicky for no good reason. 1  Things have been getting on top of me. 0  I have been so unhappy that I have had difficulty sleeping. 0  I have felt sad or miserable. 0  I have been so unhappy that I have been crying. 0  The thought of harming myself has occurred to me. 0  Edinburgh Postnatal Depression Scale Total 3      After visit meds:  Allergies as of 02/19/2020      Reactions   Selsun Blue [selenium Sulfide]       Medication List    TAKE these medications   ibuprofen 600 MG tablet Commonly known as: ADVIL Take   1 tablet (600 mg total) by mouth every 6 (six) hours.   levothyroxine 175 MCG tablet Commonly known as: SYNTHROID Take 175 mcg by mouth daily before breakfast.   prenatal multivitamin Tabs tablet Take 1 tablet by mouth daily at 12 noon.        Discharge home in stable condition Infant Feeding: Breast Infant Disposition:home with mother Discharge instruction: per After Visit Summary and Postpartum booklet. Activity: Advance as tolerated. Pelvic rest for 6 weeks.  Diet: routine diet Anticipated Birth Control: Unsure Postpartum Appointment:6 weeks Additional Postpartum F/U: na Future Appointments:No future appointments. Follow up Visit:  Follow-up Information    Janyth Pupa, DO Follow up in 6 week(s).    Specialty: Obstetrics and Gynecology Contact information: [address] [place] [id number] [phone]                   02/19/2020 Annalee Genta, DO

## 2020-02-19 NOTE — Lactation Note (Signed)
This note was copied from a baby's chart. Lactation Consultation Note  Patient Name: Boy Cynithia Hakimi JHERD'E Date: 02/19/2020 Reason for consult: Follow-up assessment   P2, Baby 35 hours old.  Mother states her nipple sometimes has lipstick shape appearance.  Observed latch with good depth and lips flanged. Reminded mother to keep baby close to breast. Feed on demand with cues.  Goal 8-12+ times per day after first 24 hrs.  Place baby STS if not cueing.  Reviewed engorgement care and monitoring voids/stools.    Maternal Data    Feeding Feeding Type: Breast Fed  LATCH Score Latch: Grasps breast easily, tongue down, lips flanged, rhythmical sucking.  Audible Swallowing: Spontaneous and intermittent  Type of Nipple: Everted at rest and after stimulation  Comfort (Breast/Nipple): Soft / non-tender  Hold (Positioning): Assistance needed to correctly position infant at breast and maintain latch.  LATCH Score: 9  Interventions Interventions: Breast feeding basics reviewed;Assisted with latch;Hand express  Lactation Tools Discussed/Used     Consult Status Consult Status: Complete Date: 02/19/20    Dahlia Byes [facility] 02/19/2020, 9:53 AM

## 2020-02-20 ENCOUNTER — Ambulatory Visit: Payer: Self-pay

## 2020-02-20 NOTE — Lactation Note (Signed)
This note was copied from a baby's chart. Lactation Consultation Note  Patient Name: Candace Mcintosh JJHER'D Date: 02/20/2020 Reason for consult: Follow-up assessment;Term  P2 mother whose infant is now 78 hours old.  This is a term baby at 39+3 weeks. Mother breast fed her first child ( now 21 months) for 14 months.  Mother was finishing her breakfast and RN in room.  Baby had been feeding prior to this but RN feels like baby would be interested in feeding again.  Offered to assist with latching and mother agreeable.  Mother stated she initially had issues latching her first child and wants to be sure she can latch effectively with this baby.  Mother's breasts are soft and non tender and nipples are everted and intact.  Positioned mother appropriately and observed her latching to the right breast.  Baby latched with flanged lips.  Explained how mother can be certain the latch is effective and do perform a gentle chin tug as needed.  Mother denied pain with this latch.  Demonstrated breast compressions and audible swallows noted.  Mother also observed swallowing.  Engorgement prevention/treatment reviewed.  Mother has a manual pump and a DEBP for home use.  Father present.  Mother will call for any questions/concerns after discharge.   Maternal Data    Feeding Feeding Type: Breast Fed  LATCH Score Latch: Grasps breast easily, tongue down, lips flanged, rhythmical sucking.  Audible Swallowing: Spontaneous and intermittent  Type of Nipple: Everted at rest and after stimulation  Comfort (Breast/Nipple): Soft / non-tender  Hold (Positioning): Assistance needed to correctly position infant at breast and maintain latch.  LATCH Score: 9  Interventions Interventions: Breast feeding basics reviewed;Assisted with latch;Skin to skin;Breast massage;Hand express;Breast compression;Adjust position;Support pillows  Lactation Tools Discussed/Used     Consult Status Consult Status:  Complete Date: 02/20/20 Follow-up type: Call as needed    Cyra Spader R Satcha Storlie 02/20/2020, 9:12 AM

## 2020-02-26 ENCOUNTER — Inpatient Hospital Stay (HOSPITAL_COMMUNITY): Payer: BC Managed Care – PPO

## 2020-02-26 ENCOUNTER — Inpatient Hospital Stay (HOSPITAL_COMMUNITY)
Admission: AD | Admit: 2020-02-26 | Payer: BC Managed Care – PPO | Source: Home / Self Care | Admitting: Obstetrics & Gynecology

## 2020-08-02 IMAGING — US US MFM OB LIMITED
1 series · 15 of 23 positions shown · non-contrast
Comparison: none

[Series 1: us mfm ob limited · 23 acquisitions, 15 frames shown]
[im 1/23]
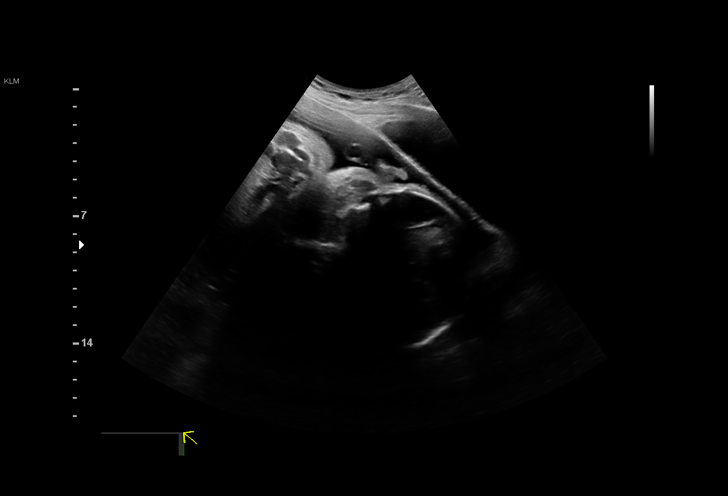
[im 3/23]
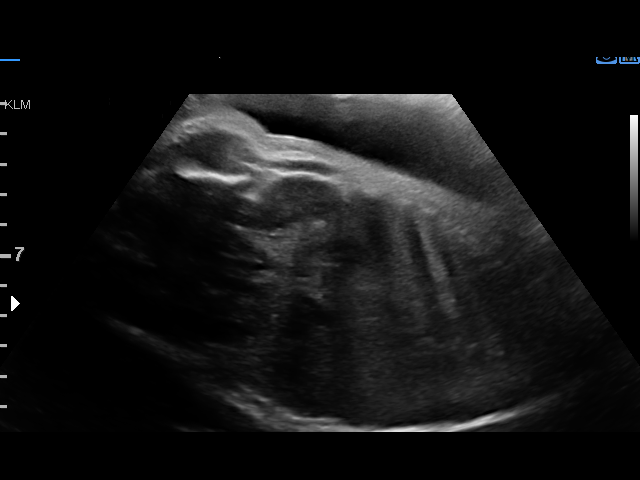
[im 4/23]
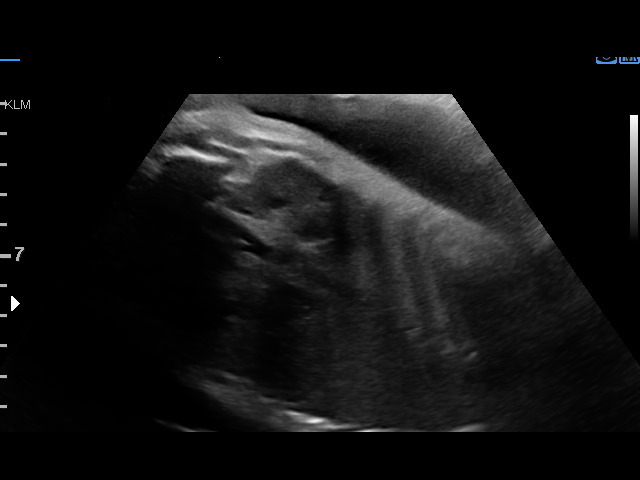
[im 6/23]
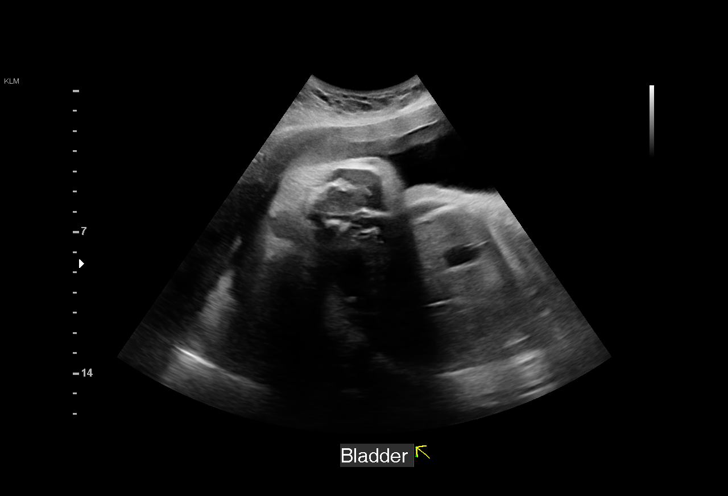
[im 7/23]
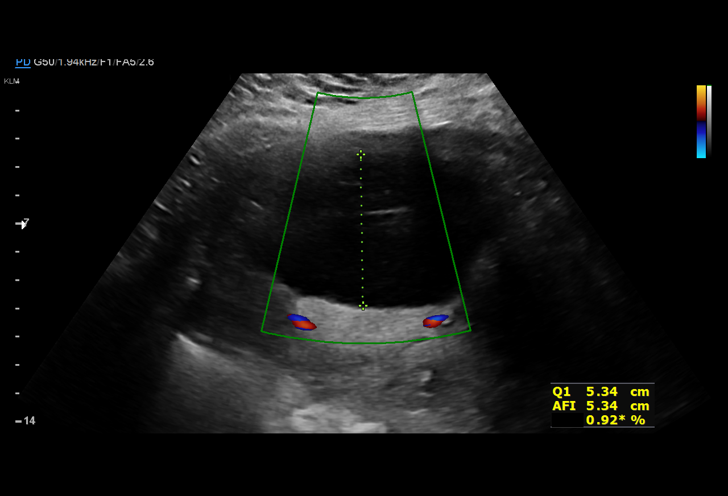
[im 9/23]
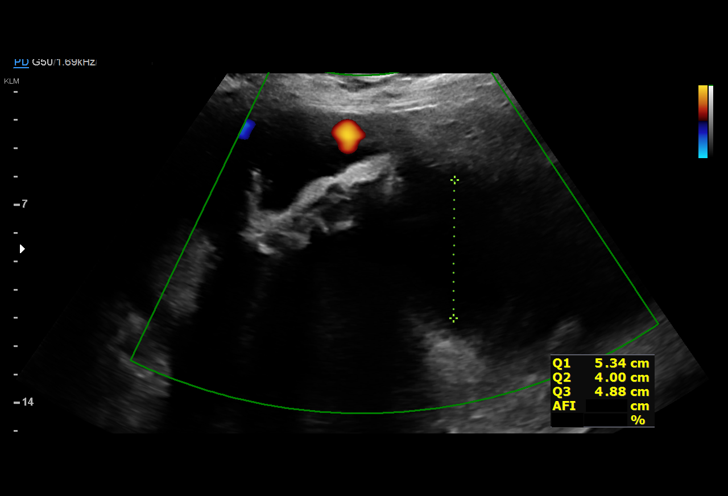
[im 10/23]
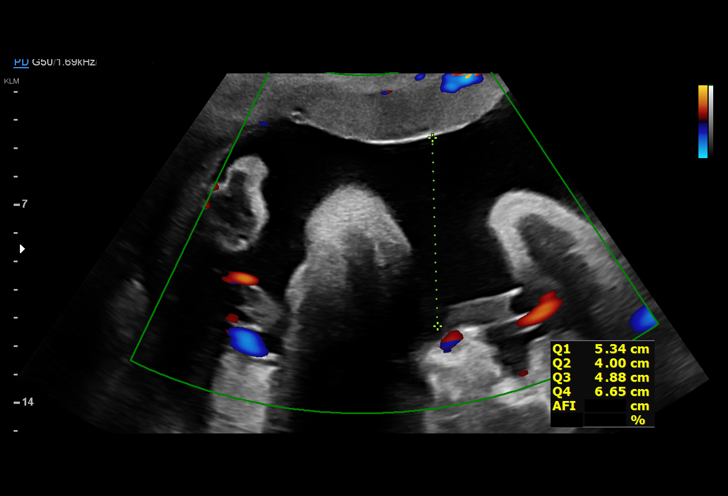
[im 12/23]
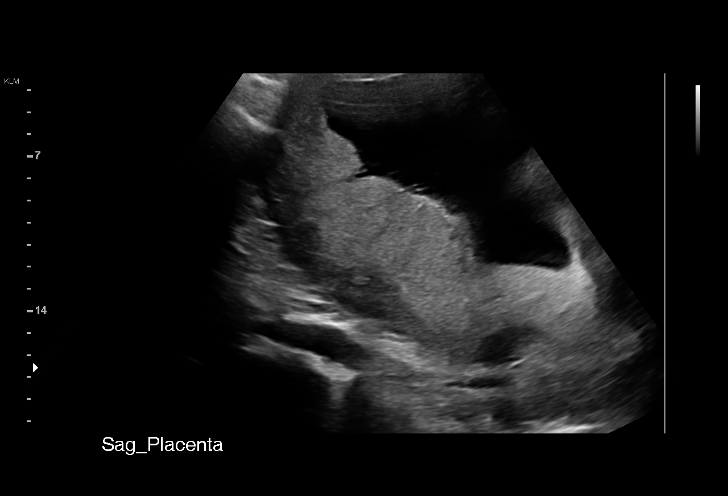
[im 14/23]
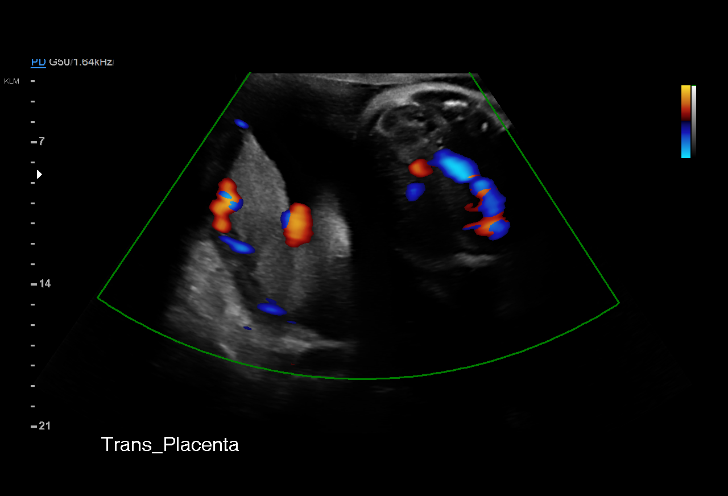
[im 15/23]
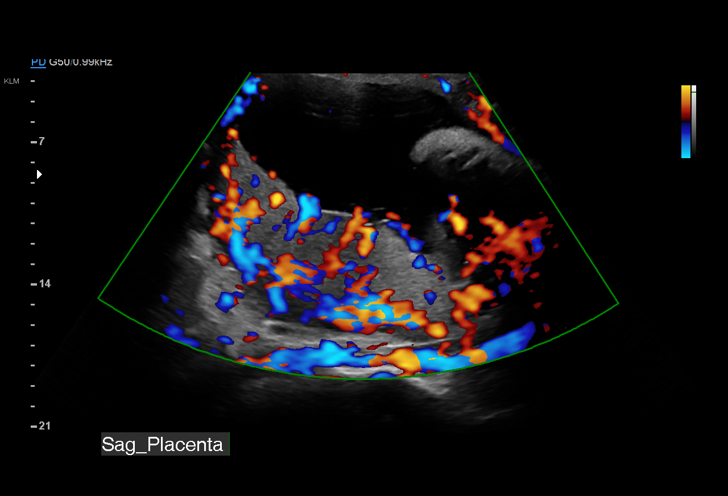
[im 17/23]
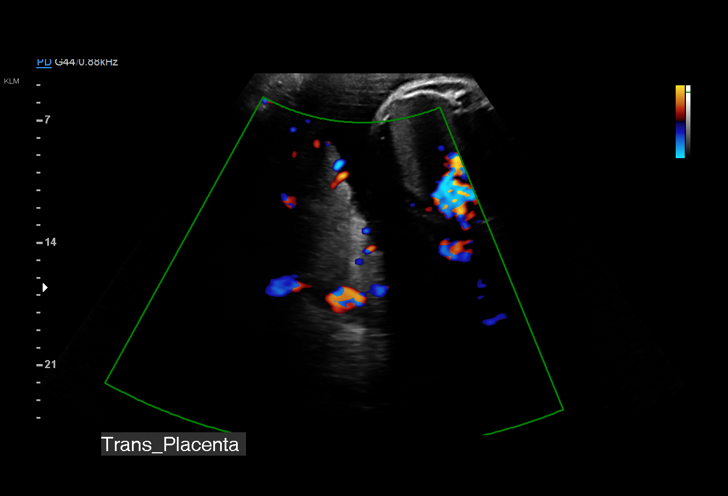
[im 18/23]
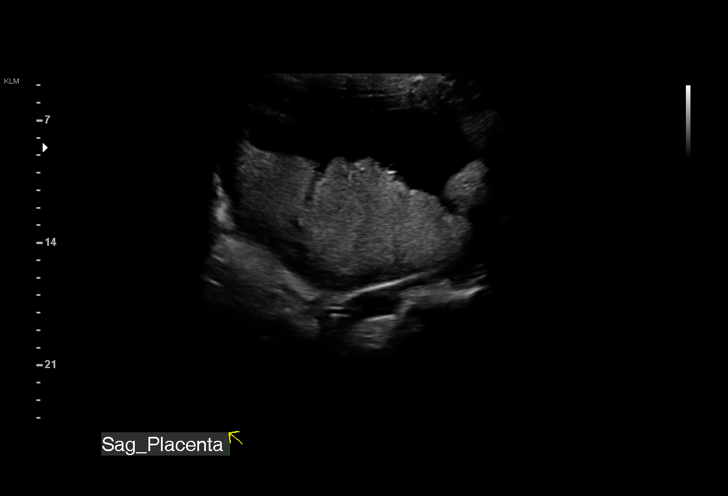
[im 20/23]
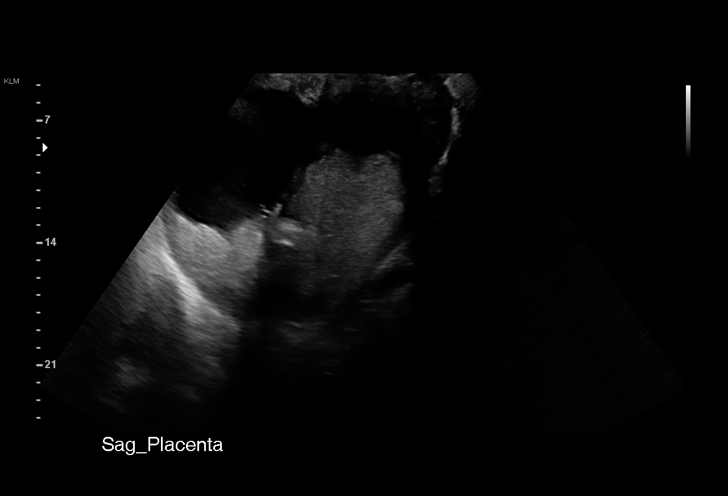
[im 21/23]
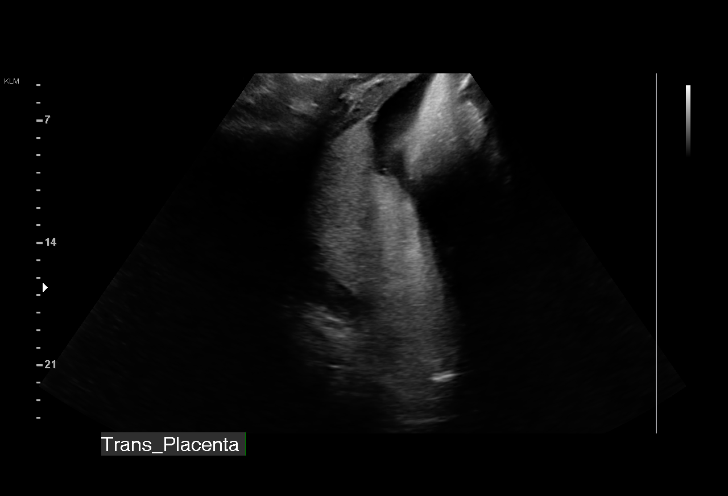
[im 23/23]
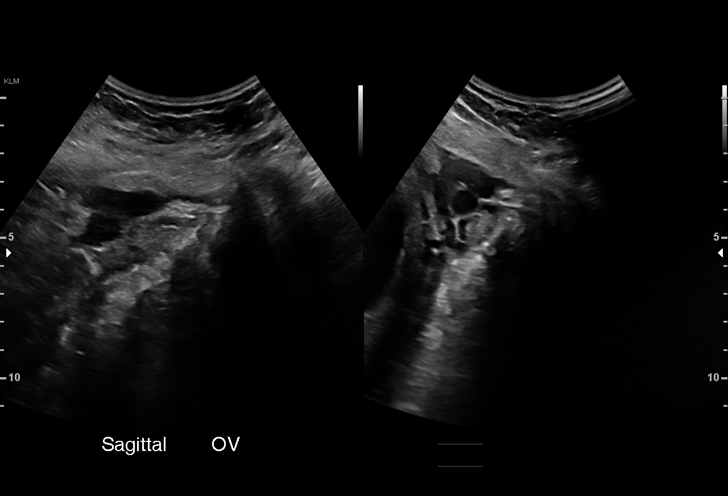

[15 of 23 positions shown; findings below may reference images not displayed]

Attending:        Mirkala Burmester        Secondary Phy.:   [facility]-
                                                            MAU/Triage
                   VERGA NP

  1  US MFM OB LIMITED                    76815.01     HONOUR MATOTO
 ----------------------------------------------------------------------

 ----------------------------------------------------------------------
Indications

  Vaginal bleeding in pregnancy, third trimester
  34 weeks gestation of pregnancy
  Leakage of amniotic fluid
 ----------------------------------------------------------------------
Fetal Evaluation

 Num Of Fetuses:         1
 Fetal Heart Rate(bpm):  138
 Cardiac Activity:       Observed
 Presentation:           Cephalic
 Placenta:               Posterior
 P. Cord Insertion:      Not well visualized

 Amniotic Fluid
 AFI FV:      Within normal limits

 AFI Sum(cm)     %Tile       Largest Pocket(cm)
 20.87           78

 RUQ(cm)       RLQ(cm)       LUQ(cm)        LLQ(cm)
 5.34          6.65          4

 Comment:    No placental abruption or previa identified. Technically difficult to
             evaluate posterior placenta.
OB History

 Gravidity:    1         Term:   0        Prem:   0        SAB:   0
 TOP:          0       Ectopic:  0        Living: 0
Gestational Age
 Best:          34w 4d     Det. By:  Early Ultrasound         EDD:   05/17/18
Anatomy

 Stomach:               Appears normal, left   Bladder:                Appears normal
                        sided
Cervix Uterus Adnexa

 Cervix
 Not visualized (advanced GA >86wks)

 Uterus
 No abnormality visualized.

 Left Ovary
 Within normal limits.

 Right Ovary
 Within normal limits.

 Cul De Sac
 No free fluid seen.

 Adnexa
 No abnormality visualized.
Impression

 Patient was evaluated for c/o vaginal bleeding.
 A limited ultrasound study was performed. Amniotic fluid is
 normal and good fetal activity is seen. Placenta looks normal
 with no evidence of abruption. Ultrasound has limitations in
 diagnosing placental abruption.
                 Staab, Arvind

## 2021-10-28 ENCOUNTER — Ambulatory Visit
Admission: EM | Admit: 2021-10-28 | Discharge: 2021-10-28 | Disposition: A | Discharge status: Home / Self Care | Payer: BC Managed Care – PPO | Attending: Internal Medicine | Admitting: Internal Medicine

## 2021-10-28 DIAGNOSIS — J029 Acute pharyngitis, unspecified: Secondary | ICD-10-CM | POA: Diagnosis not present

## 2021-10-28 LAB — POCT RAPID STREP A (OFFICE): Rapid Strep A Screen: NEGATIVE

## 2021-10-28 NOTE — ED Triage Notes (Signed)
Pt c/o sore throat, and mild fever (52F) that began yesterday. She reports her toddler is sick at home (tested negative for flu, covid, and RSV).   Home interventions: cough drops, dayquil

## 2021-10-28 NOTE — ED Provider Notes (Signed)
[facility]    CSN: [id number] Arrival date & time: 10/28/21  [id number]      History   Chief Complaint Chief Complaint  Patient presents with   Sore Throat    HPI Candace Mcintosh is a 36 y.o. female comes to urgent care with 1 day history of sore throat.  Patient endorses a sick contact at home with similar symptoms.  She denies any cough or ear pain.  No nausea or vomiting.  No diarrhea.  Her child tested negative for COVID, flu, RSV.  No shortness of breath or wheezing.  Patient has some generalized body aches.  She has tried warm salt water gargle with no improvement in throat pain. HPI  Past Medical History:  Diagnosis Date   Asthma    in high school   Graves disease    Graves disease    Seizures (HCC)    seizures only when a baby    Patient Active Problem List   Diagnosis Date Noted   Intrauterine normal pregnancy 02/17/2020   Rh negative, maternal 02/17/2020   Postpartum care following vaginal delivery 9/21 02/17/2020   SVD (spontaneous vaginal delivery) 02/17/2020   Perineal laceration with delivery, first degree 02/17/2020   Hypothyroidism affecting pregnancy 05/25/2018    Past Surgical History:  Procedure Laterality Date   THYROIDECTOMY     WISDOM TOOTH EXTRACTION      OB History     Gravida  2   Para  2   Term  2   Preterm      AB      Living  2      SAB      IAB      Ectopic      Multiple  0   Live Births  2            Home Medications    Prior to Admission medications   Medication Sig Start Date End Date Taking? Authorizing Provider  acetaminophen (TYLENOL) 500 MG tablet Take 500 mg by mouth every 6 (six) hours as needed for mild pain or headache.    [provider]  ibuprofen (ADVIL) 600 MG tablet Take 1 tablet (600 mg total) by mouth every 6 (six) hours. 02/19/20   Myna Hidalgozan, Jennifer, DO  levothyroxine (SYNTHROID, LEVOTHROID) 175 MCG tablet Take 175 mcg by mouth daily before breakfast.    [provider]  Prenatal Vit-Fe Fumarate-FA (PRENATAL MULTIVITAMIN) TABS tablet Take 1 tablet by mouth daily at 12 noon.    [provider]    Family History Family History  Problem Relation Age of Onset   Hypertension Mother    Hypertension Father    Hyperlipidemia Father    Hypertension Maternal Grandmother    Hyperlipidemia Maternal Grandmother    Diabetes Maternal Grandmother    Breast cancer Maternal Grandmother    Hypertension Maternal Grandfather    Heart disease Maternal Grandfather    Cancer Paternal Grandmother    Hyperlipidemia Paternal Grandfather    Hypertension Paternal Grandfather     Social History Social History   Tobacco Use   Smoking status: Never   Smokeless tobacco: Never  Vaping Use   Vaping Use: Never used  Substance Use Topics   Alcohol use: Not Currently   Drug use: Never     Allergies   Selsun blue [selenium sulfide]   Review of Systems Review of Systems  Constitutional:  Positive for appetite change.  HENT:  Positive for sore throat and  trouble swallowing. Negative for voice change.   Respiratory: Negative.    Cardiovascular: Negative.   Gastrointestinal: Negative.   Musculoskeletal:  Positive for myalgias.    Physical Exam Triage Vital Signs ED Triage Vitals  Enc Vitals Group     BP 10/28/21 0853 117/80     Pulse Rate 10/28/21 0853 71     Resp 10/28/21 0853 16     Temp 10/28/21 0853 98.3 F (36.8 C)     Temp Source 10/28/21 0853 Oral     SpO2 10/28/21 0853 96 %     Weight --      Height --      Head Circumference --      Peak Flow --      Pain Score 10/28/21 0852 8     Pain Loc --      Pain Edu? --      Excl. in GC? --    No data found.  Updated Vital Signs BP 117/80 (BP Location: Right Arm)   Pulse 71   Temp 98.3 F (36.8 C) (Oral)   Resp 16   LMP 10/23/2021   SpO2 96%   Visual Acuity Right Eye Distance:   Left Eye Distance:   Bilateral Distance:    Right Eye Near:   Left Eye Near:     Bilateral Near:     Physical Exam Vitals and nursing note reviewed.  Constitutional:      General: She is not in acute distress.    Appearance: She is not ill-appearing.  HENT:     Right Ear: Tympanic membrane normal.     Left Ear: Tympanic membrane normal.     Mouth/Throat:     Mouth: Mucous membranes are moist. Mucous membranes are pale.     Pharynx: Posterior oropharyngeal erythema present.     Tonsils: No tonsillar exudate or tonsillar abscesses.  Cardiovascular:     Rate and Rhythm: Normal rate and regular rhythm.  Pulmonary:     Effort: Pulmonary effort is normal.     Breath sounds: Normal breath sounds.  Neurological:     Mental Status: She is alert.     UC Treatments / Results  Labs (all labs ordered are listed, but only abnormal results are displayed) Labs Reviewed  CULTURE, GROUP A STREP [facility])  POCT RAPID STREP A (OFFICE)    EKG   Radiology No results found.  Procedures Procedures (including critical care time)  Medications Ordered in UC Medications - No data to display  Initial Impression / Assessment and Plan / UC Course  I have reviewed the triage vital signs and the nursing notes.  Pertinent labs & imaging results that were available during my care of the patient were reviewed by me and considered in my medical decision making (see chart for details).     1.  Acute viral pharyngitis: Continue warm salt water gargle Point-of-care strep test is negative Throat cultures have been sent Chloraseptic throat spray No indication for testing for RSV, flu A/B or COVID Return precautions given. Final Clinical Impressions(s) / UC Diagnoses   Final diagnoses:  Acute viral pharyngitis     Discharge Instructions      Please continue warm salt water gargle Ibuprofen or Tylenol as needed for pain and/or fever of 101.5 or greater Chloraseptic throat spray as needed for throat pain We will send swabs or for throat culture We will call you with  recommendations if labs are abnormal.   ED Prescriptions   None  PDMP not reviewed this encounter.   Merrilee Jansky, MD 10/28/21 [phone]

## 2021-10-28 NOTE — Discharge Instructions (Addendum)
Please continue warm salt water gargle Ibuprofen or Tylenol as needed for pain and/or fever of 101.5 or greater Chloraseptic throat spray as needed for throat pain We will send swabs or for throat culture We will call you with recommendations if labs are abnormal.

## 2021-10-30 LAB — CULTURE, GROUP A STREP (THRC)

## 2021-10-31 ENCOUNTER — Telehealth (HOSPITAL_COMMUNITY): Payer: Self-pay

## 2021-10-31 MED ORDER — PENICILLIN V POTASSIUM 500 MG PO TABS: 500.0000 mg | ORAL_TABLET | Freq: Two times a day (BID) | ORAL | 0 refills | Status: AC

## 2023-02-11 ENCOUNTER — Ambulatory Visit
Admission: EM | Admit: 2023-02-11 | Discharge: 2023-02-11 | Disposition: A | Discharge status: Home / Self Care | Payer: BC Managed Care – PPO | Attending: Family Medicine | Admitting: Family Medicine

## 2023-02-11 ENCOUNTER — Encounter: Payer: Self-pay | Admitting: Emergency Medicine

## 2023-02-11 DIAGNOSIS — T63441A Toxic effect of venom of bees, accidental (unintentional), initial encounter: Secondary | ICD-10-CM

## 2023-02-11 DIAGNOSIS — T7840XA Allergy, unspecified, initial encounter: Secondary | ICD-10-CM

## 2023-02-11 MED ORDER — PREDNISONE 50 MG PO TABS: ORAL_TABLET | ORAL | 0 refills | Status: DC

## 2023-02-11 NOTE — ED Triage Notes (Signed)
Patient states that she was stung by a bee yesterday while at the park on her right hand.  During the night it started itching, now the area is red and swollen.  Been applying and taken Tylenol.

## 2023-02-11 NOTE — Discharge Instructions (Signed)
Take the prednisone once a day for 5 days Continue antihistamines and ice

## 2023-02-11 NOTE — ED Provider Notes (Signed)
[facility]    CSN: [id number] Arrival date & time: 02/11/23  0815      History   Chief Complaint Chief Complaint  Patient presents with   Insect Bite    HPI Candace Mcintosh is a 37 y.o. female.   Patient has no known allergies to insect bites or stings.  She states she got stung by bee yesterday on the hand.  It hurt, but she did not think anything of it.  She did put some ice on the area.  Today when she woke up she has a very swollen right hand that is uncomfortable.  Not painful.  No shortness of breath.  No trouble speaking or swallowing.    Past Medical History:  Diagnosis Date   Asthma    in high school   Graves disease    Graves disease    Seizures (HCC)    seizures only when a baby    Patient Active Problem List   Diagnosis Date Noted   Intrauterine normal pregnancy 02/17/2020   Rh negative, maternal 02/17/2020   Postpartum care following vaginal delivery 9/21 02/17/2020   SVD (spontaneous vaginal delivery) 02/17/2020   Perineal laceration with delivery, first degree 02/17/2020   Hypothyroidism affecting pregnancy 05/25/2018    Past Surgical History:  Procedure Laterality Date   THYROIDECTOMY     WISDOM TOOTH EXTRACTION      OB History     Gravida  2   Para  2   Term  2   Preterm      AB      Living  2      SAB      IAB      Ectopic      Multiple  0   Live Births  2            Home Medications    Prior to Admission medications   Medication Sig Start Date End Date Taking? Authorizing Provider  levothyroxine (SYNTHROID, LEVOTHROID) 175 MCG tablet Take 175 mcg by mouth daily before breakfast.   Yes [provider]  predniSONE (DELTASONE) 50 MG tablet Take once a day for 5 days.  Take with food 02/11/23  Yes Eustace Moore, MD    Family History Family History  Problem Relation Age of Onset   Hypertension Mother    Hypertension Father    Hyperlipidemia Father    Hypertension Maternal  Grandmother    Hyperlipidemia Maternal Grandmother    Diabetes Maternal Grandmother    Breast cancer Maternal Grandmother    Hypertension Maternal Grandfather    Heart disease Maternal Grandfather    Cancer Paternal Grandmother    Hyperlipidemia Paternal Grandfather    Hypertension Paternal Grandfather     Social History Social History   Tobacco Use   Smoking status: Never   Smokeless tobacco: Never  Vaping Use   Vaping status: Never Used  Substance Use Topics   Alcohol use: Not Currently   Drug use: Never     Allergies   Selsun blue [selenium sulfide]   Review of Systems Review of Systems See HPI  Physical Exam Triage Vital Signs ED Triage Vitals  Encounter Vitals Group     BP 02/11/23 0837 122/79     Systolic BP Percentile --      Diastolic BP Percentile --      Pulse Rate 02/11/23 0837 65     Resp 02/11/23 0837 16     Temp 02/11/23 0837 98.2  F (36.8 C)     Temp Source 02/11/23 0837 Oral     SpO2 02/11/23 0837 100 %     Weight 02/11/23 0838 178 lb (80.7 kg)     Height 02/11/23 0838 5\' 7"  (1.702 m)     Head Circumference --      Peak Flow --      Pain Score 02/11/23 0838 1     Pain Loc --      Pain Education --      Exclude from Growth Chart --    No data found.  Updated Vital Signs BP 122/79 (BP Location: Right Arm)   Pulse 65   Temp 98.2 F (36.8 C) (Oral)   Resp 16   Ht 5\' 7"  (1.702 m)   Wt 80.7 kg   LMP 01/26/2023   SpO2 100%   Breastfeeding No   BMI 27.88 kg/m       Physical Exam Constitutional:      General: She is not in acute distress.    Appearance: She is well-developed.  HENT:     Head: Normocephalic and atraumatic.  Eyes:     Conjunctiva/sclera: Conjunctivae normal.     Pupils: Pupils are equal, round, and reactive to light.  Cardiovascular:     Rate and Rhythm: Normal rate.  Pulmonary:     Effort: Pulmonary effort is normal. No respiratory distress.  Abdominal:     General: There is no distension.     Palpations:  Abdomen is soft.  Musculoskeletal:        General: Normal range of motion.     Cervical back: Normal range of motion.  Skin:    General: Skin is warm and dry.     Findings: Erythema present.     Comments: The dorsum of the right hand has a sting that is present in between the fourth and fifth fingers.  There is swelling from the knuckles to the wrist all across the dorsum of the hand that is deep red, very warm  Neurological:     Mental Status: She is alert.      UC Treatments / Results  Labs (all labs ordered are listed, but only abnormal results are displayed) Labs Reviewed - No data to display  EKG   Radiology No results found.  Procedures Procedures (including critical care time)  Medications Ordered in UC Medications - No data to display  Initial Impression / Assessment and Plan / UC Course  I have reviewed the triage vital signs and the nursing notes.  Pertinent labs & imaging results that were available during my care of the patient were reviewed by me and considered in my medical decision making (see chart for details).     Final Clinical Impressions(s) / UC Diagnoses   Final diagnoses:  Bee sting reaction, accidental or unintentional, initial encounter  Allergic reaction, initial encounter     Discharge Instructions      Take the prednisone once a day for 5 days Continue antihistamines and ice   ED Prescriptions     Medication Sig Dispense Auth. Provider   predniSONE (DELTASONE) 50 MG tablet Take once a day for 5 days.  Take with food 5 tablet Eustace Moore, MD      PDMP not reviewed this encounter.   Eustace Moore, MD 02/11/23 [phone]

## 2023-07-23 ENCOUNTER — Other Ambulatory Visit: Payer: Self-pay | Admitting: Obstetrics and Gynecology

## 2023-07-23 ENCOUNTER — Other Ambulatory Visit (HOSPITAL_COMMUNITY)
Admission: RE | Admit: 2023-07-23 | Discharge: 2023-07-23 | Disposition: A | Discharge status: Home / Self Care | Payer: BC Managed Care – PPO | Source: Ambulatory Visit | Attending: Obstetrics and Gynecology | Admitting: Obstetrics and Gynecology

## 2023-07-23 DIAGNOSIS — Z01419 Encounter for gynecological examination (general) (routine) without abnormal findings: Secondary | ICD-10-CM | POA: Diagnosis present

## 2023-07-26 LAB — CYTOLOGY - PAP
Comment: NEGATIVE
Diagnosis: NEGATIVE
Diagnosis: REACTIVE
High risk HPV: NEGATIVE

## 2023-11-03 ENCOUNTER — Ambulatory Visit
Admission: EM | Admit: 2023-11-03 | Discharge: 2023-11-03 | Disposition: A | Discharge status: Home / Self Care | Attending: Family Medicine | Admitting: Family Medicine

## 2023-11-03 DIAGNOSIS — B353 Tinea pedis: Secondary | ICD-10-CM

## 2023-11-03 MED ORDER — FLUCONAZOLE 150 MG PO TABS: 150.0000 mg | ORAL_TABLET | ORAL | 0 refills | Status: AC

## 2023-11-03 NOTE — ED Provider Notes (Signed)
 [facility]  Note:  This document was prepared using Conservation officer, historic buildings and may include unintentional dictation errors.  MRN: [id number] DOB: June 07, 1985  Subjective:   Candace Mcintosh is a 38 y.o. female presenting for 1 month history of persistent itchy peeling skin in the webspaces of her left foot between the 4th and 5th and 3rd and 4th toes.  She has now started to feel more nodular lesions across the plantar surface of the same areas.  No fever, drainage of pus or bleeding.  Patient has used multiple over-the-counter topical antifungal medications with minimal relief.  No current facility-administered medications for this encounter.  Current Outpatient Medications:    levothyroxine  (SYNTHROID , LEVOTHROID) 175 MCG tablet, Take 175 mcg by mouth daily before breakfast., Disp: , Rfl:    predniSONE  (DELTASONE ) 50 MG tablet, Take once a day for 5 days.  Take with food, Disp: 5 tablet, Rfl: 0   Allergies  Allergen Reactions   Selsun Blue [Selenium Sulfide]     Past Medical History:  Diagnosis Date   Asthma    in high school   Graves disease    Graves disease    Seizures (HCC)    seizures only when a baby     Past Surgical History:  Procedure Laterality Date   THYROIDECTOMY     WISDOM TOOTH EXTRACTION      Family History  Problem Relation Age of Onset   Hypertension Mother    Hypertension Father    Hyperlipidemia Father    Hypertension Maternal Grandmother    Hyperlipidemia Maternal Grandmother    Diabetes Maternal Grandmother    Breast cancer Maternal Grandmother    Hypertension Maternal Grandfather    Heart disease Maternal Grandfather    Cancer Paternal Grandmother    Hyperlipidemia Paternal Grandfather    Hypertension Paternal Grandfather     Social History   Tobacco Use   Smoking status: Never   Smokeless tobacco: Never  Vaping Use   Vaping status: Never Used  Substance Use Topics   Alcohol use: Yes     Comment: occ   Drug use: Never    ROS   Objective:   Vitals: BP (P) 117/74 (BP Location: Right Arm)   Pulse (!) (P) 54   Temp (P) 98.7 F (37.1 C) (Oral)   Resp (P) 16   LMP 10/15/2023   SpO2 (P) 98%   Physical Exam Constitutional:      General: She is not in acute distress.    Appearance: Normal appearance. She is well-developed. She is not ill-appearing, toxic-appearing or diaphoretic.  HENT:     Head: Normocephalic and atraumatic.     Nose: Nose normal.     Mouth/Throat:     Mouth: Mucous membranes are moist.  Eyes:     General: No scleral icterus.       Right eye: No discharge.        Left eye: No discharge.     Extraocular Movements: Extraocular movements intact.  Cardiovascular:     Rate and Rhythm: Normal rate.  Pulmonary:     Effort: Pulmonary effort is normal.  Musculoskeletal:       Feet:  Feet:     Comments: Slightly tender nodular lesions, callus versus plantar wart of the left distal lateral foot border the left fifth toe and web space in between left 4th-5th toes.  Skin:    General: Skin is warm and dry.  Neurological:  General: No focal deficit present.     Mental Status: She is alert and oriented to person, place, and time.  Psychiatric:        Mood and Affect: Mood normal.        Behavior: Behavior normal.     Assessment and Plan :   PDMP not reviewed this encounter.  1. Tinea pedis of left foot    Start oral fluconazole as she has failed over-the-counter treatment.  Follow-up with podiatry regarding the nodular lesions, possible callus versus plantar warts.  Low suspicion for bacterial infection given lack of tenderness, warmth, erythema, fever.  Counseled patient on potential for adverse effects with medications prescribed/recommended today, ER and return-to-clinic precautions discussed, patient verbalized understanding.    Adolph Hoop, [place] 11/03/23 1237

## 2023-11-03 NOTE — ED Triage Notes (Signed)
 Pt c/o bumps, skin peeling and itching to left toes 4/5 x 1 month-NAD-steady gait

## 2023-11-08 ENCOUNTER — Encounter: Payer: Self-pay | Admitting: Podiatry

## 2023-11-08 ENCOUNTER — Ambulatory Visit (INDEPENDENT_AMBULATORY_CARE_PROVIDER_SITE_OTHER): Admitting: Podiatry

## 2023-11-08 DIAGNOSIS — L02619 Cutaneous abscess of unspecified foot: Secondary | ICD-10-CM

## 2023-11-08 DIAGNOSIS — L03119 Cellulitis of unspecified part of limb: Secondary | ICD-10-CM | POA: Diagnosis not present

## 2023-11-08 DIAGNOSIS — L97522 Non-pressure chronic ulcer of other part of left foot with fat layer exposed: Secondary | ICD-10-CM | POA: Diagnosis not present

## 2023-11-08 MED ORDER — AMOXICILLIN-POT CLAVULANATE 875-125 MG PO TABS: 1.0000 | ORAL_TABLET | Freq: Two times a day (BID) | ORAL | 0 refills | Status: AC

## 2023-11-08 MED ORDER — MUPIROCIN 2 % EX OINT: 1.0000 | TOPICAL_OINTMENT | Freq: Two times a day (BID) | CUTANEOUS | 0 refills | Status: AC

## 2023-11-08 NOTE — Patient Instructions (Signed)

## 2023-11-08 NOTE — Progress Notes (Signed)
 Complaint of pain drainage in the third and interdigital space on the left foot.  Complains of drainage and odor in the webspace on the left.  Went to urgent care they thought it was tinea pedis and gave her prescription for fluconazole.  No Eskalith CR Emflex.  Physical Exam:  Patient alert and oriented x 3.  No complaints of nausea, vomiting, fever, or chills  Vascular: DP pulses 2/4 bilateral. PT pulses 2/4 lateral.  Edema forefoot left edema. Capillary fill time immediate.  Dermatologic: Full-thickness ulcer penetrating subcutaneous tissue in the third webspace of the left foot.   . Measures 3 mm wide x 3 mm long x 4 mm at deep.  Purulent drainage.  Erythema 3rd and 4th toes and extending over the intermetatarsal space.  Erythema.  Purulent moderate exudate.  Slight undermining.  Odor present.  Neurologic: Grossly intact bilaterally.  Light touch intact bilaterally.  Musculoskeletal: Tenderness third webspace left foot.  Hammertoes 2 through 5  Left.  Pain in the third and metatarsal space.  Radiographs: None today  Diagnoses: 1.  Cellulitis with infection forefoot left. 2.  Full-thickness ulceration penetrating subcutaneous tissue third webspace left foot.  Plan: -New patient visit level 3 for evaluation and management. - Discussed with her infection of possible underlying hammertoes and tinea pedis.  Discussed ulcer and breakdown of the skin.  Discussed this with her discussed wound care.  Discussed things to look for as well as worsening. -Sharp debridement full-thickness ulceration in the third webspace left foot.  Sharp debridement of any devitalized tissue.  Cultures taken of the wound base and sent for culture and sensitivity.  Applied antibiotic ointment and light dressing today. - Gave written and oral wound care instructions.  Soak foot twice daily 20 minutes in Epsom warm Epsom salt water, apply Bactroban ointment and light dressing. - Dispense surgical shoe left -Rx  Augmentin 875 mg 1 p.o. twice daily for 10 days. - Rx Bactroban ointment apply to wound twice daily after soaks 1 refill   Return 3 weeks f/u ulcer and infection.  Also x-rays 3 views left foot

## 2023-11-12 ENCOUNTER — Telehealth: Admitting: Physician Assistant

## 2023-11-12 ENCOUNTER — Encounter: Payer: Self-pay | Admitting: Podiatry

## 2023-11-12 DIAGNOSIS — T7840XA Allergy, unspecified, initial encounter: Secondary | ICD-10-CM

## 2023-11-12 MED ORDER — TRIAMCINOLONE ACETONIDE 0.1 % EX CREA: 1.0000 | TOPICAL_CREAM | Freq: Two times a day (BID) | CUTANEOUS | 0 refills | Status: AC

## 2023-11-12 NOTE — Progress Notes (Signed)
 E Visit for Rash  We are sorry that you are not feeling well. Here is how we plan to help!  Based on the information you have provided I believe you are having an allergic reaction.  I recommend you take Benadryl  25 mg - 50 mg every 4 hours to control the symptoms but if they last over 24 hours it is best that you see an office based provider for follow up.  I have prescribed Triamcinolone 0.1% cream to apply topically twice daily for itching and rash.   HOME CARE:  Take cool showers and avoid direct sunlight. Apply cool compress or wet dressings. Take a bath in an oatmeal bath.  Sprinkle content of one Aveeno packet under running faucet with comfortably warm water.  Bathe for 15-20 minutes, 1-2 times daily.  Pat dry with a towel. Do not rub the rash. Use hydrocortisone cream. Take an antihistamine like Benadryl  for widespread rashes that itch.  The adult dose of Benadryl  is 25-50 mg by mouth 4 times daily. Caution:  This type of medication may cause sleepiness.  Do not drink alcohol, drive, or operate dangerous machinery while taking antihistamines.  Do not take these medications if you have prostate enlargement.  Read package instructions thoroughly on all medications that you take.  GET HELP RIGHT AWAY IF:  Symptoms don't go away after treatment. Severe itching that persists. If you rash spreads or swells. If you rash begins to smell. If it blisters and opens or develops a yellow-brown crust. You develop a fever. You have a sore throat. You become short of breath.  MAKE SURE YOU:  Understand these instructions. Will watch your condition. Will get help right away if you are not doing well or get worse.  Thank you for choosing an e-visit.  Your e-visit answers were reviewed by a board certified advanced clinical practitioner to complete your personal care plan. Depending upon the condition, your plan could have included both over the counter or prescription  medications.  Please review your pharmacy choice. Make sure the pharmacy is open so you can pick up prescription now. If there is a problem, you may contact your provider through [employer] and have the prescription routed to another pharmacy.  Your safety is important to us . If you have drug allergies check your prescription carefully.   For the next 24 hours you can use MyChart to ask questions about today's visit, request a non-urgent call back, or ask for a work or school excuse. You will get an email in the next two days asking about your experience. I hope that your e-visit has been valuable and will speed your recovery.   I have spent 5 minutes in review of e-visit questionnaire, review and updating patient chart, medical decision making and response to patient.   Angelia Kelp, PA-C

## 2023-11-13 ENCOUNTER — Other Ambulatory Visit: Payer: Self-pay | Admitting: Podiatry

## 2023-11-13 ENCOUNTER — Ambulatory Visit: Payer: Self-pay | Admitting: Podiatry

## 2023-11-14 MED ORDER — LEVOFLOXACIN 750 MG PO TABS: 750.0000 mg | ORAL_TABLET | Freq: Every day | ORAL | 0 refills | Status: AC

## 2023-11-21 ENCOUNTER — Ambulatory Visit (INDEPENDENT_AMBULATORY_CARE_PROVIDER_SITE_OTHER)

## 2023-11-21 ENCOUNTER — Ambulatory Visit (INDEPENDENT_AMBULATORY_CARE_PROVIDER_SITE_OTHER): Admitting: Podiatry

## 2023-11-21 ENCOUNTER — Encounter: Payer: Self-pay | Admitting: Podiatry

## 2023-11-21 ENCOUNTER — Other Ambulatory Visit: Payer: Self-pay | Admitting: Podiatry

## 2023-11-21 ENCOUNTER — Ambulatory Visit

## 2023-11-21 DIAGNOSIS — B353 Tinea pedis: Secondary | ICD-10-CM

## 2023-11-21 DIAGNOSIS — L97529 Non-pressure chronic ulcer of other part of left foot with unspecified severity: Secondary | ICD-10-CM

## 2023-11-21 MED ORDER — NAFTIFINE HCL 2 % EX GEL: 1.0000 g | Freq: Every day | CUTANEOUS | 3 refills | Status: DC

## 2023-11-21 NOTE — Progress Notes (Signed)
 Presents today follow-up ulceration and infection left foot.  Doing much better.  She says she had an infection improved considerably after she started taking the levofloxacin.  Was only noticed a little bit of drainage the past few days.  Swelling in the toes has gone down.   Physical exam:  General appearance: Pleasant, and in no acute distress. AOx3.  Vascular: Pedal pulses: DP 2/4 bilaterally, PT 2/4 bilaterally. Mild edema lower legs bilaterally. Capillary fill time immediate LT.  Neurological: Light touch intact feet bilaterally.  Normal Achilles reflex bilaterally.    Dermatologic:   Ulceration third interdigital space healed left with no sign.  Maceration in the sulcus on the 3rd, 4th and 5th toes and the 3rd and 4th interdigital space left.  Slight superficial ulceration on the JAP to 2.5 x 2.5 mm on the plantar to the fifth toe.  1 mm deep.  No signs of  Musculoskeletal: Hammertoe deformities 4th and 5th toes left.  Some tenderness in the fourth interdigital space.  Radiographs: 3 views left foot: Hammertoe deformities 4th and 5th toes left.  Mild tailor's bunion deformity left.  No evidence of any fractures or dislocations.  No gas noted.  No evidence of osteomyelitis.  Diagnosis: 1.  Tinea pedis left foot 2  superficial ulcer fifth toe left foot-ulcer almost healed.  Okay she just needs a prescription for Naftin  Plan: -Established office visit level 3 for evaluation and management. -Discussed that tinea pedis which may have led to the ulceration in fact in the first place.  Recommended she soak the foot for the next 3 to 4 days and place some Bactroban  on the superficial ulcer.  Just leave it uncovered that should heal up.  Just wear a sock on the foot.  She should start using the Naftin gel on the areas 2 to try to clear the tinea pedis. -Rx Naftin gel, apply twice daily to affected area foot, 60 g, 2 refills  Return 4 weeks follow-up tinea pedis

## 2023-11-26 ENCOUNTER — Telehealth: Payer: Self-pay

## 2023-11-26 NOTE — Telephone Encounter (Signed)
 Pharmacist called and left a message Naftifine  HCl (NAFTIN ) 2 % GEL is not covered by insurance - they need an alternative Please call [phone]

## 2023-11-27 MED ORDER — KETOCONAZOLE 2 % EX CREA: 1.0000 | TOPICAL_CREAM | Freq: Two times a day (BID) | CUTANEOUS | 0 refills | Status: AC

## 2023-11-27 NOTE — Addendum Note (Signed)
 Addended by: GERRIT ANDREZ CROME on: 11/27/2023 03:08 PM   Modules accepted: Orders

## 2023-11-28 ENCOUNTER — Telehealth: Payer: Self-pay | Admitting: Podiatry

## 2023-11-28 NOTE — Telephone Encounter (Signed)
 [facility] phar called. They need clarification on the application of ketoconazole . They need to know the gram amount that needs to be applied and the exact location/locations to apply.

## 2023-12-19 ENCOUNTER — Encounter: Payer: Self-pay | Admitting: Podiatry

## 2023-12-19 ENCOUNTER — Ambulatory Visit (INDEPENDENT_AMBULATORY_CARE_PROVIDER_SITE_OTHER): Admitting: Podiatry

## 2023-12-19 DIAGNOSIS — B353 Tinea pedis: Secondary | ICD-10-CM | POA: Diagnosis not present

## 2023-12-19 NOTE — Progress Notes (Signed)
 Patient presents follow-up tinea pedis left foot.  Doing better as she is been using ketoconazole  cream.  The Naftin  was not approved.  Says the rash is almost gone and she is not having any itching at this point.   Physical exam:  General appearance: Pleasant, and in no acute distress. AOx3.  Vascular: Pedal pulses: DP 2/4 bilaterally, PT 2/4 bilaterally.. Capillary fill time immediate bilaterally.  Neurological: Grossly intact  Dermatologic:   Tinea pedis in the sulcus and interdigital spaces is almost resolved.  Just some small areas of erythematous rash.  The maceration has resolved.  No signs of bacterial infection  Musculoskeletal:     Diagnosis: 1.  Tinea pedis left foot  Plan: -Established office visit for evaluation and management. - Discussed with the tinea pedis as a preventative thing she can do is for shoes and socks.  Continue using the ketoconazole  for another 2 to 3 weeks until completely resolved.  If she has any further problems with that she can call for an appointment   Return as needed

## 2023-12-28 ENCOUNTER — Other Ambulatory Visit: Payer: Self-pay

## 2023-12-28 DIAGNOSIS — B353 Tinea pedis: Secondary | ICD-10-CM

## 2023-12-28 MED ORDER — KETOCONAZOLE 2 % EX CREA: TOPICAL_CREAM | CUTANEOUS | 3 refills | Status: AC
